# Patient Record
Sex: Female | Born: 1940 | Race: White | Hispanic: No | Smoking: Never smoker
Health system: Southern US, Community
[De-identification: ages and names within clinical notes are randomized; demographics above are authoritative.]

## PROBLEM LIST (undated history)

## (undated) DIAGNOSIS — I10 Essential (primary) hypertension: Secondary | ICD-10-CM

## (undated) DIAGNOSIS — E079 Disorder of thyroid, unspecified: Secondary | ICD-10-CM

## (undated) DIAGNOSIS — E119 Type 2 diabetes mellitus without complications: Secondary | ICD-10-CM

---

## 2012-11-24 ENCOUNTER — Encounter (INDEPENDENT_AMBULATORY_CARE_PROVIDER_SITE_OTHER): Payer: Medicare Other | Admitting: Ophthalmology

## 2012-11-24 DIAGNOSIS — I10 Essential (primary) hypertension: Secondary | ICD-10-CM

## 2012-11-24 DIAGNOSIS — D3130 Benign neoplasm of unspecified choroid: Secondary | ICD-10-CM

## 2012-11-24 DIAGNOSIS — E11319 Type 2 diabetes mellitus with unspecified diabetic retinopathy without macular edema: Secondary | ICD-10-CM

## 2012-11-24 DIAGNOSIS — H2510 Age-related nuclear cataract, unspecified eye: Secondary | ICD-10-CM

## 2012-11-24 DIAGNOSIS — H35039 Hypertensive retinopathy, unspecified eye: Secondary | ICD-10-CM

## 2012-11-24 DIAGNOSIS — H43819 Vitreous degeneration, unspecified eye: Secondary | ICD-10-CM

## 2012-11-24 DIAGNOSIS — E1165 Type 2 diabetes mellitus with hyperglycemia: Secondary | ICD-10-CM

## 2013-11-28 ENCOUNTER — Ambulatory Visit (INDEPENDENT_AMBULATORY_CARE_PROVIDER_SITE_OTHER): Payer: Medicare Other | Admitting: Ophthalmology

## 2013-11-28 DIAGNOSIS — H43819 Vitreous degeneration, unspecified eye: Secondary | ICD-10-CM

## 2013-11-28 DIAGNOSIS — H2510 Age-related nuclear cataract, unspecified eye: Secondary | ICD-10-CM

## 2013-11-28 DIAGNOSIS — H35039 Hypertensive retinopathy, unspecified eye: Secondary | ICD-10-CM

## 2013-11-28 DIAGNOSIS — H3530 Unspecified macular degeneration: Secondary | ICD-10-CM

## 2013-11-28 DIAGNOSIS — I10 Essential (primary) hypertension: Secondary | ICD-10-CM

## 2013-11-28 DIAGNOSIS — D3130 Benign neoplasm of unspecified choroid: Secondary | ICD-10-CM

## 2014-12-18 ENCOUNTER — Ambulatory Visit (INDEPENDENT_AMBULATORY_CARE_PROVIDER_SITE_OTHER): Payer: Medicare Other | Admitting: Ophthalmology

## 2014-12-18 DIAGNOSIS — H43813 Vitreous degeneration, bilateral: Secondary | ICD-10-CM

## 2014-12-18 DIAGNOSIS — D3131 Benign neoplasm of right choroid: Secondary | ICD-10-CM

## 2014-12-18 DIAGNOSIS — H35033 Hypertensive retinopathy, bilateral: Secondary | ICD-10-CM

## 2014-12-18 DIAGNOSIS — I10 Essential (primary) hypertension: Secondary | ICD-10-CM

## 2014-12-18 DIAGNOSIS — H2513 Age-related nuclear cataract, bilateral: Secondary | ICD-10-CM

## 2014-12-18 DIAGNOSIS — H3531 Nonexudative age-related macular degeneration: Secondary | ICD-10-CM

## 2015-12-20 ENCOUNTER — Ambulatory Visit (INDEPENDENT_AMBULATORY_CARE_PROVIDER_SITE_OTHER): Payer: Medicare Other | Admitting: Ophthalmology

## 2015-12-20 DIAGNOSIS — H2513 Age-related nuclear cataract, bilateral: Secondary | ICD-10-CM

## 2015-12-20 DIAGNOSIS — H353111 Nonexudative age-related macular degeneration, right eye, early dry stage: Secondary | ICD-10-CM | POA: Diagnosis not present

## 2015-12-20 DIAGNOSIS — H43813 Vitreous degeneration, bilateral: Secondary | ICD-10-CM | POA: Diagnosis not present

## 2015-12-20 DIAGNOSIS — I10 Essential (primary) hypertension: Secondary | ICD-10-CM

## 2015-12-20 DIAGNOSIS — H35033 Hypertensive retinopathy, bilateral: Secondary | ICD-10-CM | POA: Diagnosis not present

## 2015-12-20 DIAGNOSIS — D3131 Benign neoplasm of right choroid: Secondary | ICD-10-CM

## 2016-08-06 ENCOUNTER — Other Ambulatory Visit (HOSPITAL_COMMUNITY): Payer: Self-pay | Admitting: *Deleted

## 2016-08-06 DIAGNOSIS — Z1231 Encounter for screening mammogram for malignant neoplasm of breast: Secondary | ICD-10-CM

## 2016-09-03 ENCOUNTER — Ambulatory Visit (HOSPITAL_COMMUNITY)
Admission: RE | Admit: 2016-09-03 | Discharge: 2016-09-03 | Disposition: A | Discharge status: Home / Self Care | Payer: Medicare Other | Source: Ambulatory Visit | Attending: *Deleted | Admitting: *Deleted

## 2016-09-03 DIAGNOSIS — Z1231 Encounter for screening mammogram for malignant neoplasm of breast: Secondary | ICD-10-CM | POA: Diagnosis not present

## 2017-01-07 ENCOUNTER — Ambulatory Visit (INDEPENDENT_AMBULATORY_CARE_PROVIDER_SITE_OTHER): Payer: Medicare Other | Admitting: Ophthalmology

## 2017-01-07 DIAGNOSIS — H43813 Vitreous degeneration, bilateral: Secondary | ICD-10-CM

## 2017-01-07 DIAGNOSIS — I10 Essential (primary) hypertension: Secondary | ICD-10-CM | POA: Diagnosis not present

## 2017-01-07 DIAGNOSIS — H353111 Nonexudative age-related macular degeneration, right eye, early dry stage: Secondary | ICD-10-CM | POA: Diagnosis not present

## 2017-01-07 DIAGNOSIS — H2513 Age-related nuclear cataract, bilateral: Secondary | ICD-10-CM

## 2017-01-07 DIAGNOSIS — H35033 Hypertensive retinopathy, bilateral: Secondary | ICD-10-CM

## 2017-01-07 DIAGNOSIS — D3131 Benign neoplasm of right choroid: Secondary | ICD-10-CM | POA: Diagnosis not present

## 2017-08-05 ENCOUNTER — Other Ambulatory Visit (HOSPITAL_COMMUNITY): Payer: Self-pay | Admitting: *Deleted

## 2017-08-05 DIAGNOSIS — Z1231 Encounter for screening mammogram for malignant neoplasm of breast: Secondary | ICD-10-CM

## 2017-09-07 ENCOUNTER — Encounter (HOSPITAL_COMMUNITY): Payer: Self-pay | Admitting: Radiology

## 2017-09-07 ENCOUNTER — Ambulatory Visit (HOSPITAL_COMMUNITY)
Admission: RE | Admit: 2017-09-07 | Discharge: 2017-09-07 | Disposition: A | Discharge status: Home / Self Care | Payer: Medicare Other | Source: Ambulatory Visit | Attending: *Deleted | Admitting: *Deleted

## 2017-09-07 DIAGNOSIS — Z1231 Encounter for screening mammogram for malignant neoplasm of breast: Secondary | ICD-10-CM | POA: Diagnosis present

## 2018-01-13 ENCOUNTER — Ambulatory Visit (INDEPENDENT_AMBULATORY_CARE_PROVIDER_SITE_OTHER): Payer: Medicare Other | Admitting: Ophthalmology

## 2018-01-21 ENCOUNTER — Encounter (INDEPENDENT_AMBULATORY_CARE_PROVIDER_SITE_OTHER): Payer: Medicare Other | Admitting: Ophthalmology

## 2018-01-21 DIAGNOSIS — H43813 Vitreous degeneration, bilateral: Secondary | ICD-10-CM | POA: Diagnosis not present

## 2018-01-21 DIAGNOSIS — H35033 Hypertensive retinopathy, bilateral: Secondary | ICD-10-CM

## 2018-01-21 DIAGNOSIS — D3131 Benign neoplasm of right choroid: Secondary | ICD-10-CM | POA: Diagnosis not present

## 2018-01-21 DIAGNOSIS — H353111 Nonexudative age-related macular degeneration, right eye, early dry stage: Secondary | ICD-10-CM

## 2018-01-21 DIAGNOSIS — H2513 Age-related nuclear cataract, bilateral: Secondary | ICD-10-CM

## 2018-01-21 DIAGNOSIS — I10 Essential (primary) hypertension: Secondary | ICD-10-CM

## 2018-08-23 ENCOUNTER — Other Ambulatory Visit (HOSPITAL_COMMUNITY): Payer: Self-pay | Admitting: *Deleted

## 2018-08-23 DIAGNOSIS — Z1231 Encounter for screening mammogram for malignant neoplasm of breast: Secondary | ICD-10-CM

## 2018-09-13 ENCOUNTER — Ambulatory Visit (HOSPITAL_COMMUNITY)
Admission: RE | Admit: 2018-09-13 | Discharge: 2018-09-13 | Disposition: A | Discharge status: Home / Self Care | Payer: Medicare Other | Source: Ambulatory Visit | Attending: *Deleted | Admitting: *Deleted

## 2018-09-13 DIAGNOSIS — Z1231 Encounter for screening mammogram for malignant neoplasm of breast: Secondary | ICD-10-CM

## 2019-01-24 ENCOUNTER — Encounter (INDEPENDENT_AMBULATORY_CARE_PROVIDER_SITE_OTHER): Payer: Medicare Other | Admitting: Ophthalmology

## 2019-01-24 DIAGNOSIS — I10 Essential (primary) hypertension: Secondary | ICD-10-CM | POA: Diagnosis not present

## 2019-01-24 DIAGNOSIS — D3131 Benign neoplasm of right choroid: Secondary | ICD-10-CM

## 2019-01-24 DIAGNOSIS — H35033 Hypertensive retinopathy, bilateral: Secondary | ICD-10-CM

## 2019-01-24 DIAGNOSIS — H353111 Nonexudative age-related macular degeneration, right eye, early dry stage: Secondary | ICD-10-CM

## 2019-01-24 DIAGNOSIS — H43813 Vitreous degeneration, bilateral: Secondary | ICD-10-CM

## 2019-08-24 ENCOUNTER — Other Ambulatory Visit (HOSPITAL_COMMUNITY): Payer: Self-pay | Admitting: Family

## 2019-08-24 DIAGNOSIS — Z1231 Encounter for screening mammogram for malignant neoplasm of breast: Secondary | ICD-10-CM

## 2019-09-16 ENCOUNTER — Ambulatory Visit (HOSPITAL_COMMUNITY)
Admission: RE | Admit: 2019-09-16 | Discharge: 2019-09-16 | Disposition: A | Discharge status: Home / Self Care | Payer: Medicare Other | Source: Ambulatory Visit | Attending: Family | Admitting: Family

## 2019-09-16 ENCOUNTER — Other Ambulatory Visit: Payer: Self-pay

## 2019-09-16 DIAGNOSIS — Z1231 Encounter for screening mammogram for malignant neoplasm of breast: Secondary | ICD-10-CM | POA: Diagnosis not present

## 2019-09-19 ENCOUNTER — Other Ambulatory Visit (HOSPITAL_COMMUNITY): Payer: Self-pay | Admitting: Family

## 2019-09-19 DIAGNOSIS — R928 Other abnormal and inconclusive findings on diagnostic imaging of breast: Secondary | ICD-10-CM

## 2019-10-04 ENCOUNTER — Ambulatory Visit (HOSPITAL_COMMUNITY)
Admission: RE | Admit: 2019-10-04 | Discharge: 2019-10-04 | Disposition: A | Discharge status: Home / Self Care | Payer: Medicare Other | Source: Ambulatory Visit | Attending: Family | Admitting: Family

## 2019-10-04 ENCOUNTER — Other Ambulatory Visit: Payer: Self-pay

## 2019-10-04 ENCOUNTER — Ambulatory Visit (HOSPITAL_COMMUNITY): Admission: RE | Admit: 2019-10-04 | Payer: Medicare Other | Source: Ambulatory Visit

## 2019-10-04 DIAGNOSIS — R928 Other abnormal and inconclusive findings on diagnostic imaging of breast: Secondary | ICD-10-CM | POA: Insufficient documentation

## 2019-10-04 DIAGNOSIS — Z20822 Contact with and (suspected) exposure to covid-19: Secondary | ICD-10-CM

## 2019-10-07 LAB — NOVEL CORONAVIRUS, NAA: SARS-CoV-2, NAA: NOT DETECTED

## 2020-01-25 ENCOUNTER — Other Ambulatory Visit: Payer: Self-pay

## 2020-01-25 ENCOUNTER — Encounter (INDEPENDENT_AMBULATORY_CARE_PROVIDER_SITE_OTHER): Payer: Medicare Other | Admitting: Ophthalmology

## 2020-01-25 DIAGNOSIS — H35033 Hypertensive retinopathy, bilateral: Secondary | ICD-10-CM

## 2020-01-25 DIAGNOSIS — D3131 Benign neoplasm of right choroid: Secondary | ICD-10-CM

## 2020-01-25 DIAGNOSIS — H43813 Vitreous degeneration, bilateral: Secondary | ICD-10-CM

## 2020-01-25 DIAGNOSIS — I10 Essential (primary) hypertension: Secondary | ICD-10-CM

## 2020-01-25 DIAGNOSIS — H353131 Nonexudative age-related macular degeneration, bilateral, early dry stage: Secondary | ICD-10-CM | POA: Diagnosis not present

## 2020-06-20 ENCOUNTER — Other Ambulatory Visit (HOSPITAL_COMMUNITY): Payer: Self-pay | Admitting: Family

## 2020-06-20 DIAGNOSIS — Z1231 Encounter for screening mammogram for malignant neoplasm of breast: Secondary | ICD-10-CM

## 2020-10-08 ENCOUNTER — Other Ambulatory Visit: Payer: Self-pay

## 2020-10-08 ENCOUNTER — Ambulatory Visit (HOSPITAL_COMMUNITY)
Admission: RE | Admit: 2020-10-08 | Discharge: 2020-10-08 | Disposition: A | Discharge status: Home / Self Care | Payer: Medicare Other | Source: Ambulatory Visit | Attending: Family | Admitting: Family

## 2020-10-08 DIAGNOSIS — Z1231 Encounter for screening mammogram for malignant neoplasm of breast: Secondary | ICD-10-CM | POA: Insufficient documentation

## 2021-01-24 ENCOUNTER — Encounter (INDEPENDENT_AMBULATORY_CARE_PROVIDER_SITE_OTHER): Payer: Medicare Other | Admitting: Ophthalmology

## 2021-02-07 ENCOUNTER — Encounter (INDEPENDENT_AMBULATORY_CARE_PROVIDER_SITE_OTHER): Payer: Medicare Other | Admitting: Ophthalmology

## 2021-02-11 ENCOUNTER — Other Ambulatory Visit: Payer: Self-pay

## 2021-02-11 ENCOUNTER — Encounter (INDEPENDENT_AMBULATORY_CARE_PROVIDER_SITE_OTHER): Payer: Medicare Other | Admitting: Ophthalmology

## 2021-02-11 DIAGNOSIS — H353131 Nonexudative age-related macular degeneration, bilateral, early dry stage: Secondary | ICD-10-CM

## 2021-02-11 DIAGNOSIS — D3131 Benign neoplasm of right choroid: Secondary | ICD-10-CM

## 2021-02-11 DIAGNOSIS — H35033 Hypertensive retinopathy, bilateral: Secondary | ICD-10-CM | POA: Diagnosis not present

## 2021-02-11 DIAGNOSIS — I10 Essential (primary) hypertension: Secondary | ICD-10-CM

## 2021-02-11 DIAGNOSIS — H43813 Vitreous degeneration, bilateral: Secondary | ICD-10-CM

## 2021-09-03 ENCOUNTER — Other Ambulatory Visit (HOSPITAL_COMMUNITY): Payer: Self-pay | Admitting: *Deleted

## 2021-09-03 ENCOUNTER — Other Ambulatory Visit (HOSPITAL_COMMUNITY): Payer: Self-pay | Admitting: Family

## 2021-09-03 DIAGNOSIS — Z1231 Encounter for screening mammogram for malignant neoplasm of breast: Secondary | ICD-10-CM

## 2021-10-11 ENCOUNTER — Ambulatory Visit (HOSPITAL_COMMUNITY): Payer: Medicare Other

## 2021-10-14 ENCOUNTER — Ambulatory Visit (HOSPITAL_COMMUNITY): Payer: Medicare Other

## 2021-10-18 ENCOUNTER — Other Ambulatory Visit: Payer: Self-pay

## 2021-10-18 ENCOUNTER — Ambulatory Visit (HOSPITAL_COMMUNITY)
Admission: RE | Admit: 2021-10-18 | Discharge: 2021-10-18 | Disposition: A | Discharge status: Home / Self Care | Payer: Medicare Other | Source: Ambulatory Visit | Attending: Family | Admitting: Family

## 2021-10-18 DIAGNOSIS — Z1231 Encounter for screening mammogram for malignant neoplasm of breast: Secondary | ICD-10-CM | POA: Diagnosis not present

## 2022-02-17 ENCOUNTER — Encounter (INDEPENDENT_AMBULATORY_CARE_PROVIDER_SITE_OTHER): Payer: Medicare Other | Admitting: Ophthalmology

## 2022-02-28 ENCOUNTER — Encounter (INDEPENDENT_AMBULATORY_CARE_PROVIDER_SITE_OTHER): Payer: Medicare Other | Admitting: Ophthalmology

## 2022-02-28 ENCOUNTER — Other Ambulatory Visit: Payer: Self-pay

## 2022-02-28 DIAGNOSIS — H353112 Nonexudative age-related macular degeneration, right eye, intermediate dry stage: Secondary | ICD-10-CM

## 2022-02-28 DIAGNOSIS — H35033 Hypertensive retinopathy, bilateral: Secondary | ICD-10-CM

## 2022-02-28 DIAGNOSIS — I10 Essential (primary) hypertension: Secondary | ICD-10-CM

## 2022-02-28 DIAGNOSIS — H353121 Nonexudative age-related macular degeneration, left eye, early dry stage: Secondary | ICD-10-CM | POA: Diagnosis not present

## 2022-02-28 DIAGNOSIS — H43813 Vitreous degeneration, bilateral: Secondary | ICD-10-CM

## 2022-02-28 DIAGNOSIS — D3131 Benign neoplasm of right choroid: Secondary | ICD-10-CM

## 2022-09-22 ENCOUNTER — Other Ambulatory Visit (HOSPITAL_COMMUNITY): Payer: Self-pay | Admitting: Internal Medicine

## 2022-09-22 DIAGNOSIS — Z1231 Encounter for screening mammogram for malignant neoplasm of breast: Secondary | ICD-10-CM

## 2022-10-22 ENCOUNTER — Ambulatory Visit (HOSPITAL_COMMUNITY)
Admission: RE | Admit: 2022-10-22 | Discharge: 2022-10-22 | Disposition: A | Discharge status: Home / Self Care | Payer: Medicare Other | Source: Ambulatory Visit | Attending: Internal Medicine | Admitting: Internal Medicine

## 2022-10-22 DIAGNOSIS — Z1231 Encounter for screening mammogram for malignant neoplasm of breast: Secondary | ICD-10-CM | POA: Insufficient documentation

## 2023-02-27 ENCOUNTER — Encounter (INDEPENDENT_AMBULATORY_CARE_PROVIDER_SITE_OTHER): Payer: Medicare Other | Admitting: Ophthalmology

## 2023-04-11 IMAGING — MG MM DIGITAL SCREENING BILAT W/ TOMO AND CAD
8 series · 8 of 24 positions shown · non-contrast
Comparison: Previous exam(s).

CLINICAL DATA: Screening.

EXAM:
DIGITAL SCREENING BILATERAL MAMMOGRAM WITH TOMOSYNTHESIS AND CAD
TECHNIQUE: Bilateral screening digital craniocaudal and mediolateral oblique
mammograms were obtained. Bilateral screening digital breast
tomosynthesis was performed. The images were evaluated with
computer-aided detection.

[L CC synth-2D]
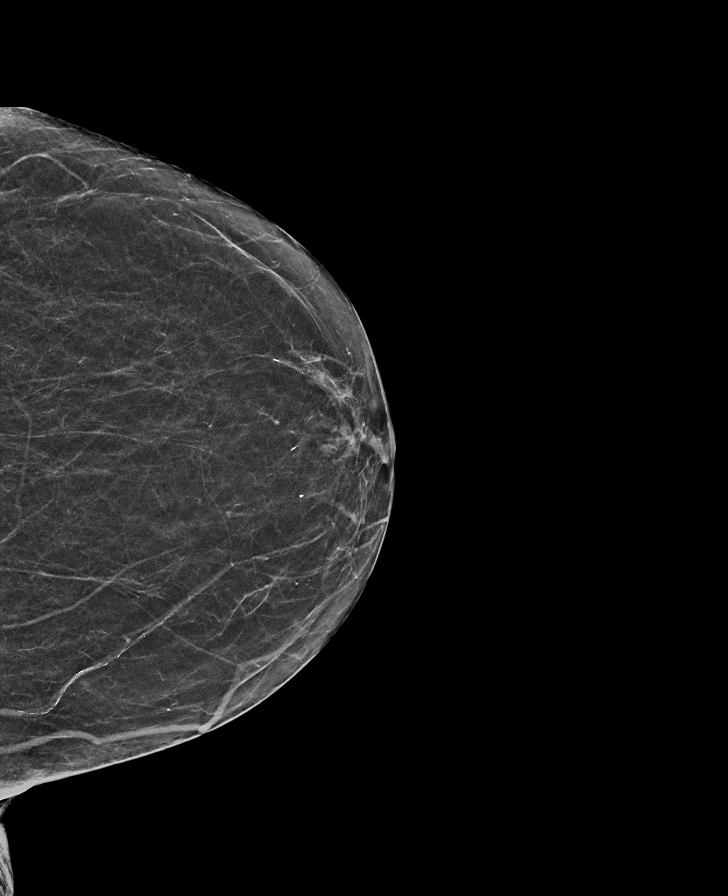

[R MLO synth-2D]
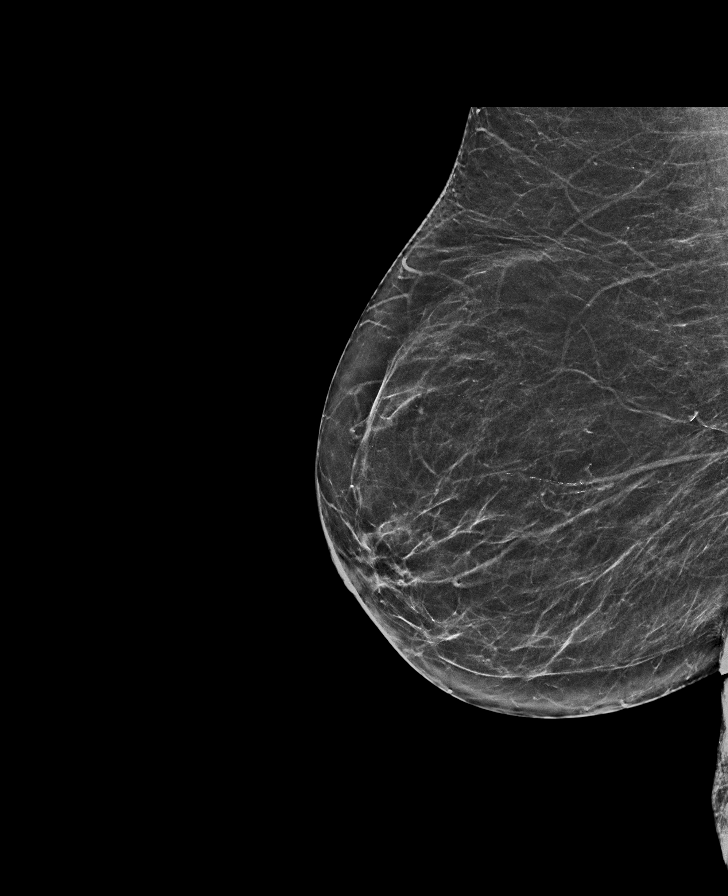

[L MLO synth-2D]
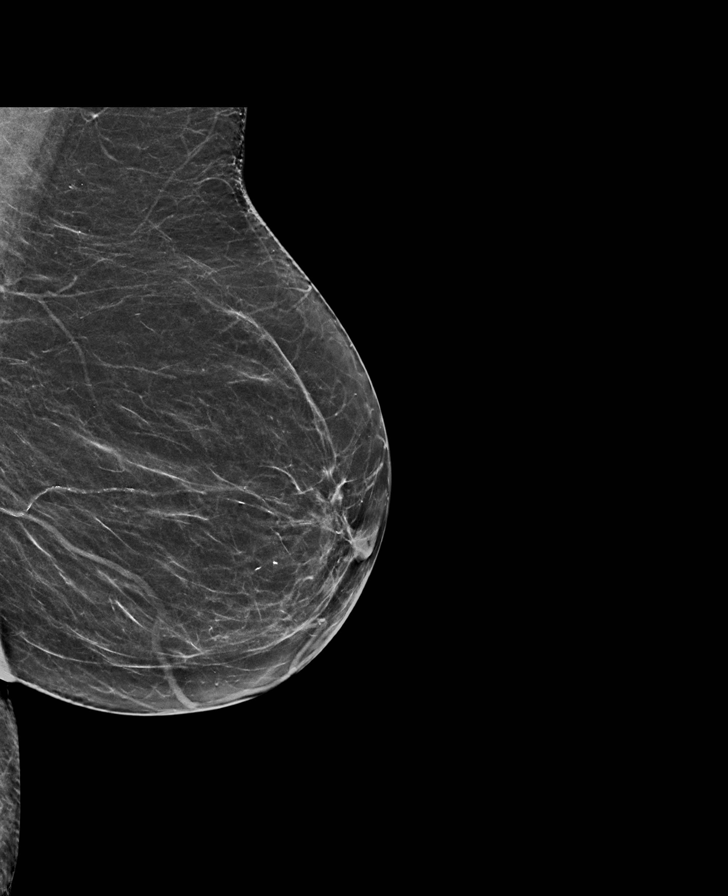

[R CC synth-2D]
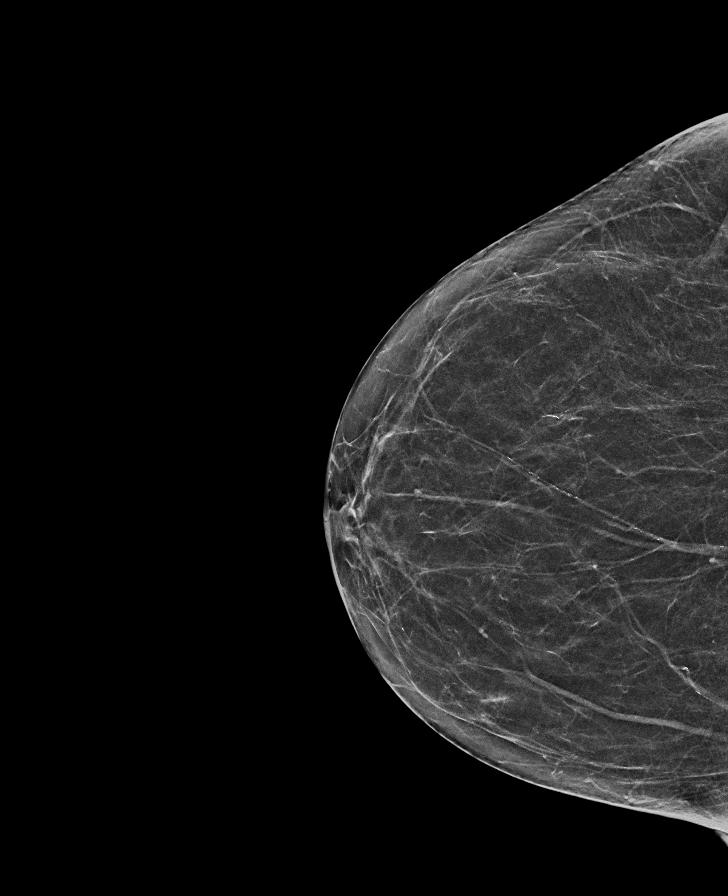

[L MLO tomo · tomo slice 30/59.0]
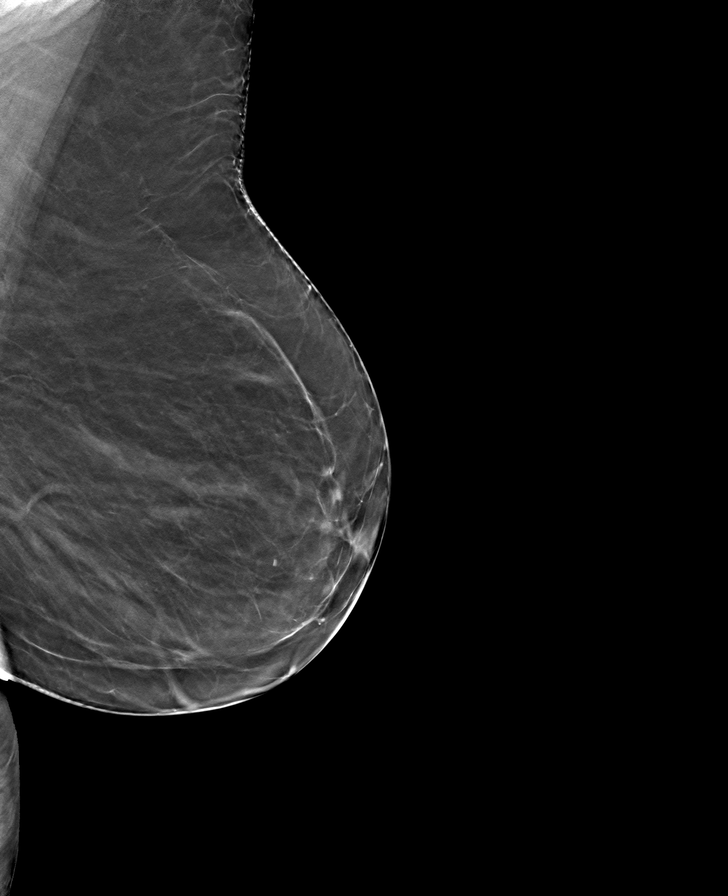

[R CC tomo · tomo slice 27/54.0]
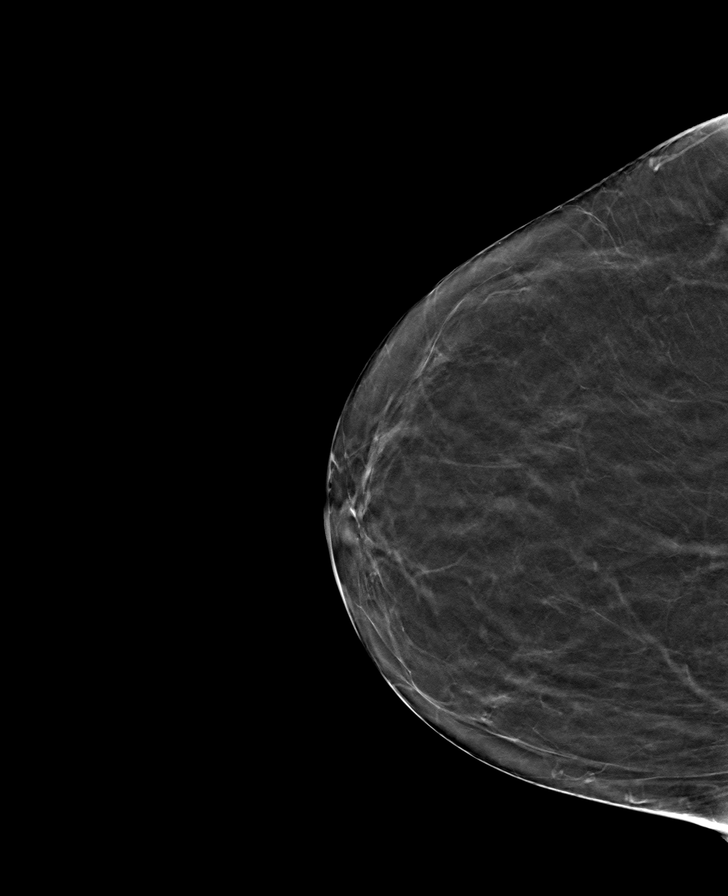

[L CC tomo · tomo slice 25/50.0]
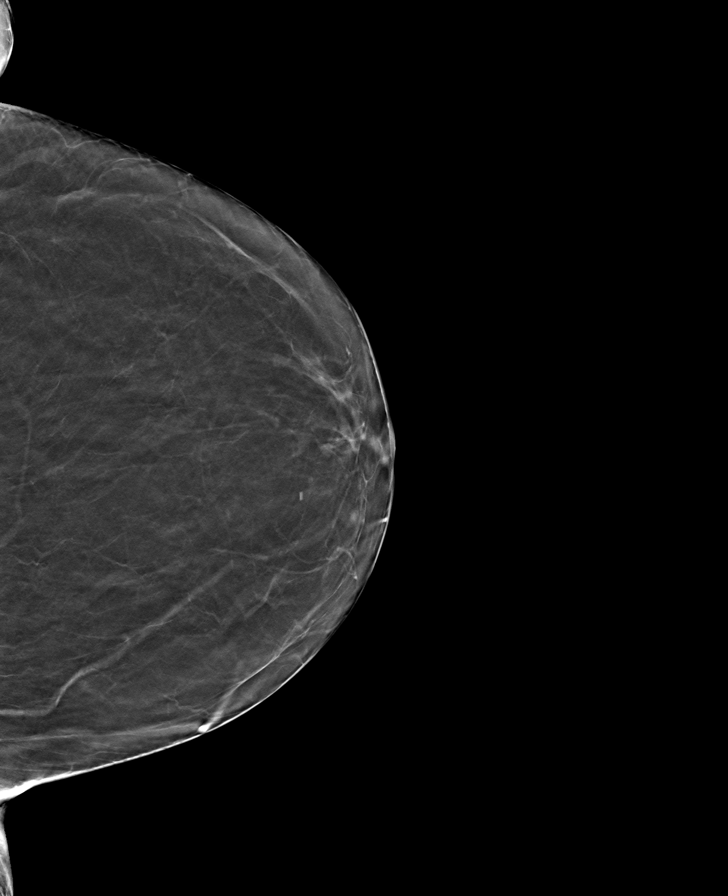

[R MLO tomo · tomo slice 31/60.0]
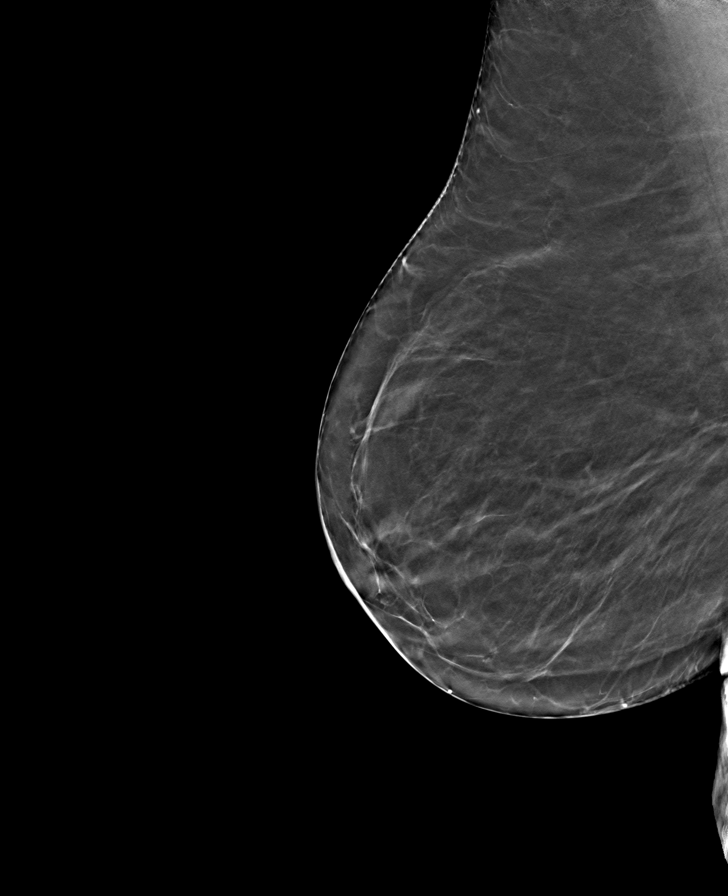

[8 of 24 positions shown; findings below may reference images not displayed]

ACR Breast Density Category b: There are scattered areas of
fibroglandular density.
FINDINGS: There are no findings suspicious for malignancy.
IMPRESSION: No mammographic evidence of malignancy. A result letter of this
screening mammogram will be mailed directly to the patient.

RECOMMENDATION:
Screening mammogram in one year. (Code:51-O-LD2)

BI-RADS CATEGORY  1: Negative.

## 2023-05-03 ENCOUNTER — Encounter (HOSPITAL_COMMUNITY): Payer: Self-pay

## 2023-05-03 ENCOUNTER — Other Ambulatory Visit: Payer: Self-pay

## 2023-05-03 ENCOUNTER — Inpatient Hospital Stay (HOSPITAL_COMMUNITY)
Admission: EM | Admit: 2023-05-03 | Discharge: 2023-05-08 | DRG: 470 | Disposition: A | Discharge status: Skilled Nursing Facility | Payer: Medicare Other | Source: Ambulatory Visit | Attending: Family Medicine | Admitting: Family Medicine

## 2023-05-03 ENCOUNTER — Emergency Department (HOSPITAL_COMMUNITY): Payer: Medicare Other

## 2023-05-03 DIAGNOSIS — Y838 Other surgical procedures as the cause of abnormal reaction of the patient, or of later complication, without mention of misadventure at the time of the procedure: Secondary | ICD-10-CM | POA: Diagnosis not present

## 2023-05-03 DIAGNOSIS — S72009A Fracture of unspecified part of neck of unspecified femur, initial encounter for closed fracture: Secondary | ICD-10-CM | POA: Diagnosis present

## 2023-05-03 DIAGNOSIS — M2110 Varus deformity, not elsewhere classified, unspecified site: Secondary | ICD-10-CM | POA: Diagnosis not present

## 2023-05-03 DIAGNOSIS — S72001S Fracture of unspecified part of neck of right femur, sequela: Secondary | ICD-10-CM | POA: Diagnosis not present

## 2023-05-03 DIAGNOSIS — E119 Type 2 diabetes mellitus without complications: Secondary | ICD-10-CM | POA: Diagnosis present

## 2023-05-03 DIAGNOSIS — S72001A Fracture of unspecified part of neck of right femur, initial encounter for closed fracture: Secondary | ICD-10-CM | POA: Diagnosis not present

## 2023-05-03 DIAGNOSIS — W010XXA Fall on same level from slipping, tripping and stumbling without subsequent striking against object, initial encounter: Secondary | ICD-10-CM | POA: Diagnosis present

## 2023-05-03 DIAGNOSIS — Z885 Allergy status to narcotic agent status: Secondary | ICD-10-CM

## 2023-05-03 DIAGNOSIS — I4891 Unspecified atrial fibrillation: Secondary | ICD-10-CM | POA: Diagnosis not present

## 2023-05-03 DIAGNOSIS — Z79890 Hormone replacement therapy (postmenopausal): Secondary | ICD-10-CM | POA: Diagnosis not present

## 2023-05-03 DIAGNOSIS — Y92010 Kitchen of single-family (private) house as the place of occurrence of the external cause: Secondary | ICD-10-CM

## 2023-05-03 DIAGNOSIS — Z7984 Long term (current) use of oral hypoglycemic drugs: Secondary | ICD-10-CM | POA: Diagnosis not present

## 2023-05-03 DIAGNOSIS — I480 Paroxysmal atrial fibrillation: Secondary | ICD-10-CM | POA: Diagnosis not present

## 2023-05-03 DIAGNOSIS — S72001D Fracture of unspecified part of neck of right femur, subsequent encounter for closed fracture with routine healing: Secondary | ICD-10-CM | POA: Diagnosis not present

## 2023-05-03 DIAGNOSIS — Z887 Allergy status to serum and vaccine status: Secondary | ICD-10-CM

## 2023-05-03 DIAGNOSIS — Z880 Allergy status to penicillin: Secondary | ICD-10-CM | POA: Diagnosis not present

## 2023-05-03 DIAGNOSIS — E0590 Thyrotoxicosis, unspecified without thyrotoxic crisis or storm: Secondary | ICD-10-CM | POA: Diagnosis present

## 2023-05-03 DIAGNOSIS — E039 Hypothyroidism, unspecified: Secondary | ICD-10-CM | POA: Diagnosis not present

## 2023-05-03 DIAGNOSIS — S72141A Displaced intertrochanteric fracture of right femur, initial encounter for closed fracture: Secondary | ICD-10-CM | POA: Diagnosis not present

## 2023-05-03 DIAGNOSIS — I4819 Other persistent atrial fibrillation: Secondary | ICD-10-CM | POA: Diagnosis not present

## 2023-05-03 DIAGNOSIS — S7291XA Unspecified fracture of right femur, initial encounter for closed fracture: Secondary | ICD-10-CM | POA: Diagnosis not present

## 2023-05-03 DIAGNOSIS — I97191 Other postprocedural cardiac functional disturbances following other surgery: Secondary | ICD-10-CM | POA: Diagnosis not present

## 2023-05-03 DIAGNOSIS — Z79899 Other long term (current) drug therapy: Secondary | ICD-10-CM | POA: Diagnosis not present

## 2023-05-03 DIAGNOSIS — M25551 Pain in right hip: Secondary | ICD-10-CM | POA: Diagnosis present

## 2023-05-03 DIAGNOSIS — I9789 Other postprocedural complications and disorders of the circulatory system, not elsewhere classified: Secondary | ICD-10-CM | POA: Diagnosis not present

## 2023-05-03 DIAGNOSIS — I10 Essential (primary) hypertension: Secondary | ICD-10-CM | POA: Diagnosis not present

## 2023-05-03 HISTORY — DX: Essential (primary) hypertension: I10

## 2023-05-03 HISTORY — DX: Type 2 diabetes mellitus without complications: E11.9

## 2023-05-03 HISTORY — DX: Disorder of thyroid, unspecified: E07.9

## 2023-05-03 LAB — CBC WITH DIFFERENTIAL/PLATELET
Abs Immature Granulocytes: 0.05 10*3/uL (ref 0.00–0.07)
Basophils Absolute: 0 10*3/uL (ref 0.0–0.1)
Basophils Relative: 0 %
Eosinophils Absolute: 0 10*3/uL (ref 0.0–0.5)
Eosinophils Relative: 0 %
HCT: 45.1 % (ref 36.0–46.0)
Hemoglobin: 14.3 g/dL (ref 12.0–15.0)
Immature Granulocytes: 1 %
Lymphocytes Relative: 6 %
Lymphs Abs: 0.7 10*3/uL (ref 0.7–4.0)
MCH: 28.4 pg (ref 26.0–34.0)
MCHC: 31.7 g/dL (ref 30.0–36.0)
MCV: 89.7 fL (ref 80.0–100.0)
Monocytes Absolute: 0.5 10*3/uL (ref 0.1–1.0)
Monocytes Relative: 5 %
Neutro Abs: 9.6 10*3/uL — ABNORMAL HIGH (ref 1.7–7.7)
Neutrophils Relative %: 88 %
Platelets: 227 10*3/uL (ref 150–400)
RBC: 5.03 MIL/uL (ref 3.87–5.11)
RDW: 14.7 % (ref 11.5–15.5)
WBC: 10.8 10*3/uL — ABNORMAL HIGH (ref 4.0–10.5)
nRBC: 0 % (ref 0.0–0.2)

## 2023-05-03 LAB — COMPREHENSIVE METABOLIC PANEL
ALT: 18 U/L (ref 0–44)
AST: 22 U/L (ref 15–41)
Albumin: 3.8 g/dL (ref 3.5–5.0)
Alkaline Phosphatase: 71 U/L (ref 38–126)
Anion gap: 11 (ref 5–15)
BUN: 17 mg/dL (ref 8–23)
CO2: 24 mmol/L (ref 22–32)
Calcium: 9 mg/dL (ref 8.9–10.3)
Chloride: 104 mmol/L (ref 98–111)
Creatinine, Ser: 0.82 mg/dL (ref 0.44–1.00)
GFR, Estimated: >60 mL/min (ref >60)
Glucose, Bld: 201 mg/dL — ABNORMAL HIGH (ref 70–99)
Potassium: 3.6 mmol/L (ref 3.5–5.1)
Sodium: 139 mmol/L (ref 135–145)
Total Bilirubin: 0.7 mg/dL (ref 0.3–1.2)
Total Protein: 6.6 g/dL (ref 6.5–8.1)

## 2023-05-03 LAB — MRSA NEXT GEN BY PCR, NASAL: MRSA by PCR Next Gen: NOT DETECTED

## 2023-05-03 LAB — CBG MONITORING, ED: Glucose-Capillary: 155 mg/dL — ABNORMAL HIGH (ref 70–99)

## 2023-05-03 MED ORDER — CLINDAMYCIN PHOSPHATE 900 MG/50ML IV SOLN: 900.0000 mg | Freq: Once | INTRAVENOUS | Status: DC

## 2023-05-03 MED ORDER — ACETAMINOPHEN 650 MG RE SUPP: 650.0000 mg | Freq: Four times a day (QID) | RECTAL | Status: DC | PRN

## 2023-05-03 MED ORDER — CLINDAMYCIN PHOSPHATE 900 MG/50ML IV SOLN
900.0000 mg | INTRAVENOUS | Status: AC
  Administered 2023-05-04: 900 mg via INTRAVENOUS
  Filled 2023-05-03 (×2): qty 50

## 2023-05-03 MED ORDER — TRANEXAMIC ACID-NACL 1000-0.7 MG/100ML-% IV SOLN
1000.0000 mg | INTRAVENOUS | Status: AC
  Administered 2023-05-04: 1000 mg via INTRAVENOUS

## 2023-05-03 MED ORDER — INSULIN ASPART 100 UNIT/ML IJ SOLN
0.0000 [IU] | Freq: Three times a day (TID) | INTRAMUSCULAR | Status: DC
  Administered 2023-05-03 – 2023-05-04 (×2): 2 [IU] via SUBCUTANEOUS
  Administered 2023-05-05: 1 [IU] via SUBCUTANEOUS
  Administered 2023-05-05: 2 [IU] via SUBCUTANEOUS
  Administered 2023-05-06: 3 [IU] via SUBCUTANEOUS
  Administered 2023-05-06: 2 [IU] via SUBCUTANEOUS
  Administered 2023-05-06: 3 [IU] via SUBCUTANEOUS
  Administered 2023-05-07 (×3): 2 [IU] via SUBCUTANEOUS
  Administered 2023-05-08: 1 [IU] via SUBCUTANEOUS
  Filled 2023-05-03: qty 1

## 2023-05-03 MED ORDER — METHOCARBAMOL 1000 MG/10ML IJ SOLN
500.0000 mg | Freq: Four times a day (QID) | INTRAVENOUS | Status: DC | PRN
  Administered 2023-05-03: 500 mg via INTRAVENOUS
  Filled 2023-05-03: qty 5

## 2023-05-03 MED ORDER — FENTANYL CITRATE PF 50 MCG/ML IJ SOSY
25.0000 ug | PREFILLED_SYRINGE | INTRAMUSCULAR | Status: DC | PRN
  Administered 2023-05-03 – 2023-05-04 (×2): 25 ug via INTRAVENOUS
  Filled 2023-05-03 (×3): qty 1

## 2023-05-03 MED ORDER — ONDANSETRON HCL 4 MG PO TABS: 4.0000 mg | ORAL_TABLET | Freq: Four times a day (QID) | ORAL | Status: DC | PRN

## 2023-05-03 MED ORDER — ACETAMINOPHEN 325 MG PO TABS
650.0000 mg | ORAL_TABLET | Freq: Four times a day (QID) | ORAL | Status: DC | PRN
  Administered 2023-05-03 – 2023-05-06 (×2): 650 mg via ORAL
  Filled 2023-05-03 (×2): qty 2

## 2023-05-03 MED ORDER — FENTANYL CITRATE PF 50 MCG/ML IJ SOSY
25.0000 ug | PREFILLED_SYRINGE | Freq: Once | INTRAMUSCULAR | Status: AC
  Administered 2023-05-03: 25 ug via INTRAVENOUS
  Filled 2023-05-03: qty 1

## 2023-05-03 MED ORDER — HYDRALAZINE HCL 20 MG/ML IJ SOLN
10.0000 mg | Freq: Four times a day (QID) | INTRAMUSCULAR | Status: DC | PRN
  Administered 2023-05-04: 10 mg via INTRAVENOUS
  Filled 2023-05-03: qty 1

## 2023-05-03 MED ORDER — LEVOTHYROXINE SODIUM 50 MCG PO TABS: 25.0000 ug | ORAL_TABLET | Freq: Every morning | ORAL | Status: DC

## 2023-05-03 MED ORDER — ONDANSETRON HCL 4 MG/2ML IJ SOLN: 4.0000 mg | Freq: Four times a day (QID) | INTRAMUSCULAR | Status: DC | PRN

## 2023-05-03 MED ORDER — OXYCODONE HCL 5 MG PO TABS
5.0000 mg | ORAL_TABLET | Freq: Four times a day (QID) | ORAL | Status: DC | PRN
  Administered 2023-05-03 – 2023-05-08 (×9): 5 mg via ORAL
  Filled 2023-05-03 (×9): qty 1

## 2023-05-03 MED ORDER — CHLORHEXIDINE GLUCONATE CLOTH 2 % EX PADS
6.0000 | MEDICATED_PAD | Freq: Every day | CUTANEOUS | Status: DC
  Administered 2023-05-04 – 2023-05-07 (×4): 6 via TOPICAL

## 2023-05-03 MED ORDER — IRBESARTAN 150 MG PO TABS
300.0000 mg | ORAL_TABLET | Freq: Every day | ORAL | Status: DC
  Administered 2023-05-03: 300 mg via ORAL
  Filled 2023-05-03: qty 2

## 2023-05-03 MED ORDER — HEPARIN SODIUM (PORCINE) 5000 UNIT/ML IJ SOLN
5000.0000 [IU] | Freq: Once | INTRAMUSCULAR | Status: AC
  Administered 2023-05-03: 5000 [IU] via SUBCUTANEOUS
  Filled 2023-05-03: qty 1

## 2023-05-03 MED ORDER — METHOCARBAMOL 1000 MG/10ML IJ SOLN
INTRAMUSCULAR | Status: AC
  Filled 2023-05-03: qty 10

## 2023-05-03 MED ORDER — LEVOTHYROXINE SODIUM 25 MCG PO TABS
25.0000 ug | ORAL_TABLET | Freq: Every morning | ORAL | Status: DC
  Administered 2023-05-04 – 2023-05-06 (×3): 25 ug via ORAL
  Filled 2023-05-03 (×3): qty 1

## 2023-05-03 NOTE — H&P (Signed)
TRH H&P    Patient Demographics:    Traci Koch, is a 82 y.o. female  MRN: [id number]  DOB - [birth year]  Admit Date - 05/03/2023  Referring MD/NP/PA: Pauline Aus  Outpatient Primary MD for the patient is Rebekah Chesterfield, NP  Patient coming from: Home  Chief complaint-fall   HPI:    Traci Koch  is a 82 y.o. female, with medical history of diabetes mellitus type 2, hypertension, hypothyroidism who came to hospital after patient had fall at home.  Patient said that she was getting ready to go to church when her pants got caught in the drawer causing her to fall.  She fell on the right side, called her son who brought her to the hospital.  Patient admits to having pain in the right hip area.  X-ray of the urgent care was done and patient was advised to go to the ER for further evaluation. In the ED x-ray of the hip shows right hip fracture.  Orthopedic surgery has been consulted by ED provider.  Plan for ORIF in a.m.  Denies passing out. Denies chest pain or shortness of breath No fever or chills, no abdominal pain Did not hit her head, no headache Denies dysuria Patient usually ambulates without any difficulty. 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    Review of systems:    In addition to the HPI above,    All other systems reviewed and are negative.    Past History of the following :    Past Medical History:  Diagnosis Date   Diabetes mellitus without complication (HCC)    Hypertension    Thyroid disease       History reviewed. No pertinent surgical history.    Social History:      Social History   Tobacco Use   Smoking status: Never   Smokeless tobacco: Never  Substance Use Topics   Alcohol use: Never       Family History :      Home Medications:   Prior to Admission medications   Medication Sig Start Date End Date Taking? Authorizing Provider   cholecalciferol (VITAMIN D3) 25 MCG (1000 UNIT) tablet Take 1,000 Units by mouth daily.   Yes [provider]  Cinnamon 500 MG capsule Take by mouth. 02/09/18  Yes [provider]  empagliflozin (JARDIANCE) 25 MG TABS tablet Take 25 mg by mouth daily.   Yes [provider]  ibuprofen (ADVIL) 200 MG tablet Take 800 mg by mouth every 8 (eight) hours as needed for moderate pain.   Yes [provider]  levothyroxine (SYNTHROID) 25 MCG tablet Take 1 tablet by mouth every morning. 03/25/13  Yes [provider]  metFORMIN (GLUCOPHAGE) 500 MG tablet Take 500 mg by mouth at bedtime as needed (low blood sugar). 12/31/04  Yes [provider]  Multiple Vitamins-Minerals (PRESERVISION AREDS 2) CAPS Take 1 tablet by mouth 2 (two) times daily.   Yes [provider]  olmesartan (BENICAR) 40 MG tablet Take 1 tablet by mouth daily. 02/09/18  Yes [provider]  Specialty Vitamins Products (BIOTIN PLUS KERATIN) 10000-100 MCG-MG TABS Take 1 tablet by mouth daily.   Yes [provider]     Allergies:     Allergies  Allergen Reactions   Codeine Other (See Comments)    Hallucinations    Penicillins     Swelling    Tetanus-Diphth-Acell Pertussis Swelling     Physical Exam:   Vitals  Blood pressure (!) 189/60, pulse 69, temperature 98.1 F (36.7 C), temperature source Oral, resp. rate 19, height 5\' 3"  (1.6 m), weight 79.4 kg, SpO2 96 %.  1.  General: Appears in no acute distress  2. Psychiatric: Alert, oriented x 3, intact insight and judgment  3. Neurologic: Cranial nerves II through XII grossly intact, no focal deficit noted  4. HEENMT:  Atraumatic normocephalic, extraocular muscles are intact  5. Respiratory : Lungs are clear to auscultation bilaterally  6. Cardiovascular : S1-S2, regular, no murmur auscultated  7. Gastrointestinal:  Abdomen soft, nontender, no organomegaly  8. Skin:  No rashes  noted  9.Musculoskeletal:  Right lower extremity is externally rotated, right leg is shortened and tenderness to palpation at right hip joint    Data Review:    CBC Recent Labs  Lab 05/03/23 1406  WBC 10.8*  HGB 14.3  HCT 45.1  PLT 227  MCV 89.7  MCH 28.4  MCHC 31.7  RDW 14.7  LYMPHSABS 0.7  MONOABS 0.5  EOSABS 0.0  BASOSABS 0.0   ------------------------------------------------------------------------------------------------------------------  Results for orders placed or performed during the hospital encounter of 05/03/23 (from the past 48 hour(s))  CBC with Differential     Status: Abnormal   Collection Time: 05/03/23  2:06 PM  Result Value Ref Range   WBC 10.8 (H) 4.0 - 10.5 K/uL   RBC 5.03 3.87 - 5.11 MIL/uL   Hemoglobin 14.3 12.0 - 15.0 g/dL   HCT 78.2 95.6 - 21.3 %   MCV 89.7 80.0 - 100.0 fL   MCH 28.4 26.0 - 34.0 pg   MCHC 31.7 30.0 - 36.0 g/dL   RDW 08.6 57.8 - 46.9 %   Platelets 227 150 - 400 K/uL   nRBC 0.0 0.0 - 0.2 %   Neutrophils Relative % 88 %   Neutro Abs 9.6 (H) 1.7 - 7.7 K/uL   Lymphocytes Relative 6 %   Lymphs Abs 0.7 0.7 - 4.0 K/uL   Monocytes Relative 5 %   Monocytes Absolute 0.5 0.1 - 1.0 K/uL   Eosinophils Relative 0 %   Eosinophils Absolute 0.0 0.0 - 0.5 K/uL   Basophils Relative 0 %   Basophils Absolute 0.0 0.0 - 0.1 K/uL   Immature Granulocytes 1 %   Abs Immature Granulocytes 0.05 0.00 - 0.07 K/uL    Comment: Performed at [facility], [address], [place], [place] [address]  Comprehensive metabolic panel     Status: Abnormal   Collection Time: 05/03/23  2:06 PM  Result Value Ref Range   Sodium 139 135 - 145 mmol/L   Potassium 3.6 3.5 - 5.1 mmol/L   Chloride 104 98 - 111 mmol/L   CO2 24 22 - 32 mmol/L   Glucose, Bld 201 (H) 70 - 99 mg/dL    Comment: Glucose reference range applies only to samples taken after fasting for at least 8 hours.   BUN 17 8 - 23 mg/dL   Creatinine, Ser 8.41 0.44 - 1.00 mg/dL   Calcium 9.0 8.9 - 32.4  mg/dL   Total Protein 6.6 6.5 - 8.1  g/dL   Albumin 3.8 3.5 - 5.0 g/dL   AST 22 15 - 41 U/L   ALT 18 0 - 44 U/L   Alkaline Phosphatase 71 38 - 126 U/L   Total Bilirubin 0.7 0.3 - 1.2 mg/dL   GFR, Estimated >16 >10 mL/min    Comment: (NOTE) Calculated using the CKD-EPI Creatinine Equation (2021)    Anion gap 11 5 - 15    Comment: Performed at [facility], [address], [place], [place] [address]    Chemistries  Recent Labs  Lab 05/03/23 1406  NA 139  K 3.6  CL 104  CO2 24  GLUCOSE 201*  BUN 17  CREATININE 0.82  CALCIUM 9.0  AST 22  ALT 18  ALKPHOS 71  BILITOT 0.7   ------------------------------------------------------------------------------------------------------------------  ------------------------------------------------------------------------------------------------------------------ GFR: Estimated Creatinine Clearance: 51.9 mL/min (by C-G formula based on SCr of 0.82 mg/dL). Liver Function Tests: Recent Labs  Lab 05/03/23 1406  AST 22  ALT 18  ALKPHOS 71  BILITOT 0.7  PROT 6.6  ALBUMIN 3.8   No results for input(s): "LIPASE", "AMYLASE" in the last 168 hours. No results for input(s): "AMMONIA" in the last 168 hours. Coagulation Profile: No results for input(s): "INR", "PROTIME" in the last 168 hours. Cardiac Enzymes: No results for input(s): "CKTOTAL", "CKMB", "CKMBINDEX", "TROPONINI" in the last 168 hours. BNP (last 3 results) No results for input(s): "PROBNP" in the last 8760 hours. HbA1C: No results for input(s): "HGBA1C" in the last 72 hours. CBG: No results for input(s): "GLUCAP" in the last 168 hours. Lipid Profile: No results for input(s): "CHOL", "HDL", "LDLCALC", "TRIG", "CHOLHDL", "LDLDIRECT" in the last 72 hours. Thyroid Function Tests: No results for input(s): "TSH", "T4TOTAL", "FREET4", "T3FREE", "THYROIDAB" in the last 72 hours. Anemia Panel: No results for input(s): "VITAMINB12", "FOLATE", "FERRITIN", "TIBC", "IRON", "RETICCTPCT"  in the last 72 hours.  --------------------------------------------------------------------------------------------------------------- Urine analysis: No results found for: "COLORURINE", "APPEARANCEUR", "LABSPEC", "PHURINE", "GLUCOSEU", "HGBUR", "BILIRUBINUR", "KETONESUR", "PROTEINUR", "UROBILINOGEN", "NITRITE", "LEUKOCYTESUR"    Imaging Results:    DG Chest 1 View  Result Date: 05/03/2023 CLINICAL DATA:  Larey Seat, right hip fracture EXAM: CHEST  1 VIEW COMPARISON:  None Available. FINDINGS: Single frontal view of the chest demonstrates mild enlargement of the cardiac silhouette. There is central pulmonary vascular congestion, without airspace disease, effusion, or pneumothorax. No acute bony abnormalities. IMPRESSION: 1. Enlarged cardiac silhouette, with central pulmonary vascular congestion. No overt edema. Electronically Signed   By: Sharlet Salina M.D.   On: 05/03/2023 15:04   DG Hip Unilat W or Wo Pelvis 2-3 Views Right  Result Date: 05/03/2023 CLINICAL DATA:  Larey Seat, fracture EXAM: DG HIP (WITH OR WITHOUT PELVIS) 2-3V RIGHT COMPARISON:  None Available. FINDINGS: Frontal view of the pelvis as well as frontal and cross-table lateral views of the right hip are obtained. There is a comminuted intertrochanteric right hip fracture with marked impaction and varus angulation at the fracture site. No dislocation. Symmetrical bilateral hip osteoarthritis. Remainder of the bony pelvis is unremarkable. IMPRESSION: 1. Acute comminuted intertrochanteric right hip fracture, with impaction and varus angulation at the fracture site. Electronically Signed   By: Sharlet Salina M.D.   On: 05/03/2023 15:03    My personal review of EKG: Rhythm NSR, no significant ST changes   Assessment & Plan:    Principal Problem:   Hip fracture (HCC)   Right hip fracture-orthopedic surgery has been consulted by ED provider.  Plan for ORIF in a.m. will keep n.p.o. after midnight.  Start oxycodone 5  mg p.o. every 6 hours as needed  for pain.  Hypertension -elevated blood pressure-blood pressure reading has been elevated likely from pain.  Start hydralazine 10 mg IV every 4 hours as needed for BP greater than 160/100.  Continue olmesartan.  Hypothyroidism-continue Synthroid  Diabetes mellitus type 2-patient takes metformin and empagliflozin at home.  Will hold both medications.  Start sliding scale insulin with NovoLog.  Check CBG every 6 hours.  Preop risk stratification -patient does not have any significant cardiac history, she has greater than 4 METS.  Due to her advanced age she is moderate risk for surgery.    DVT Prophylaxis-   1 dose of heparin 5000 units subcu x 1 will be given.  Further DVT prophylaxis as per orthopedics after surgery.  AM Labs Ordered, also please review Full Orders  Family Communication: Admission, patients condition and plan of care including tests being ordered have been discussed with the patient  who indicate understanding and agree with the plan and Code Status.  Code Status: Full code  Admission status: Inpatient :The appropriate admission status for this patient is INPATIENT. Inpatient status is judged to be reasonable and necessary in order to provide the required intensity of service to ensure the patient's safety. The patient's presenting symptoms, physical exam findings, and initial radiographic and laboratory data in the context of their chronic comorbidities is felt to place them at high risk for further clinical deterioration. Furthermore, it is not anticipated that the patient will be medically stable for discharge from the hospital within 2 midnights of admission. The following factors support the admission status of inpatient.        * I certify that at the point of admission it is my clinical judgment that the patient will require inpatient hospital care spanning beyond 2 midnights from the point of admission due to high intensity of service, high risk for further  deterioration and high frequency of surveillance required.*  Time spent in minutes : 60 minutes   Ariannie Penaloza S Azizah Lisle M.D

## 2023-05-03 NOTE — ED Provider Notes (Signed)
[place] EMERGENCY DEPARTMENT AT [facility] Provider Note   CSN: [id number] Arrival date & time: 05/03/23  1325     History  Chief Complaint  Patient presents with   Marletta Lor    Traci Koch is a 82 y.o. female.   Fall Pertinent negatives include no chest pain, no abdominal pain, no headaches and no shortness of breath.       Traci Koch is a 82 y.o. female with past medical history of type 2 diabetes, hypertension, and thyroid disease who presents to the Emergency Department from an urgent care for evaluation of hip pain secondary to mechanical fall that occurred earlier this morning.  Patient states that she was getting ready for church and the leg of her pants got caught on a drawer causing her to fall.  Fell on the right side.  Describes pain of the right hip area, unable to stand or bear weight.  Had x-ray at urgent care and advised to come to ER for further evaluation.  Patient denies head injury, LOC, neck pain, back pain and knee pain.  Does not take blood thinners.  Denies any headache dizziness nausea or vomiting. She has been n.p.o. this morning.    Home Medications Prior to Admission medications   Medication Sig Start Date End Date Taking? Authorizing Provider  cholecalciferol (VITAMIN D3) 25 MCG (1000 UNIT) tablet Take 1,000 Units by mouth daily.   Yes [provider]  Cinnamon 500 MG capsule Take by mouth. 02/09/18  Yes [provider]  empagliflozin (JARDIANCE) 25 MG TABS tablet Take 25 mg by mouth daily.   Yes [provider]  ibuprofen (ADVIL) 200 MG tablet Take 800 mg by mouth every 8 (eight) hours as needed for moderate pain.   Yes [provider]  levothyroxine (SYNTHROID) 25 MCG tablet Take 1 tablet by mouth every morning. 03/25/13  Yes [provider]  metFORMIN (GLUCOPHAGE) 500 MG tablet Take 500 mg by mouth at bedtime as needed (low blood sugar). 12/31/04  Yes [provider]  Multiple  Vitamins-Minerals (PRESERVISION AREDS 2) CAPS Take 1 tablet by mouth 2 (two) times daily.   Yes [provider]  olmesartan (BENICAR) 40 MG tablet Take 1 tablet by mouth daily. 02/09/18  Yes [provider]  Specialty Vitamins Products (BIOTIN PLUS KERATIN) 10000-100 MCG-MG TABS Take 1 tablet by mouth daily.   Yes [provider]      Allergies    Codeine, Penicillins, and Tetanus-diphth-acell pertussis    Review of Systems   Review of Systems  Constitutional:  Negative for appetite change, chills and fever.  Eyes:  Negative for visual disturbance.  Respiratory:  Negative for chest tightness and shortness of breath.   Cardiovascular:  Negative for chest pain.  Gastrointestinal:  Negative for abdominal pain, diarrhea, nausea and vomiting.  Genitourinary:  Negative for dysuria and flank pain.  Musculoskeletal:  Positive for arthralgias (Right hip pain). Negative for back pain and neck pain.  Skin:  Negative for color change and wound.  Neurological:  Negative for dizziness, weakness, numbness and headaches.  Psychiatric/Behavioral:  Negative for confusion.     Physical Exam Updated Vital Signs BP (!) 195/66   Pulse 65   Temp 98.1 F (36.7 C) (Oral)   Resp 18   Ht 5\' 3"  (1.6 m)   Wt 79.4 kg   LMP  (LMP Unknown) Comment: menopausal  SpO2 96%   BMI 31.00 kg/m  Physical Exam Vitals and nursing note reviewed.  Constitutional:      General: She is not in acute distress.    Appearance: Normal appearance. She is not ill-appearing or toxic-appearing.  HENT:     Head: Normocephalic and atraumatic.     Comments: No tenderness of the scalp or palpable hematomas. Eyes:     Extraocular Movements: Extraocular movements intact.     Conjunctiva/sclera: Conjunctivae normal.     Pupils: Pupils are equal, round, and reactive to light.  Cardiovascular:     Rate and Rhythm: Normal rate and regular rhythm.     Pulses: Normal pulses.  Pulmonary:     Effort:  Pulmonary effort is normal.  Abdominal:     General: There is no distension.     Palpations: Abdomen is soft.     Tenderness: There is no abdominal tenderness.  Musculoskeletal:        General: Tenderness and signs of injury present.     Cervical back: Normal range of motion. No tenderness.     Right hip: Bony tenderness present. No lacerations or crepitus. Decreased range of motion.     Right lower leg: No edema.     Left lower leg: No edema.     Comments: Tender to palpation of the anterior right hip, right leg is shortened and externally rotated.  Skin:    General: Skin is warm.     Capillary Refill: Capillary refill takes less than 2 seconds.  Neurological:     General: No focal deficit present.     Mental Status: She is alert.     Sensory: No sensory deficit.     Motor: No weakness.     ED Results / Procedures / Treatments   Labs (all labs ordered are listed, but only abnormal results are displayed) Labs Reviewed  CBC WITH DIFFERENTIAL/PLATELET - Abnormal; Notable for the following components:      Result Value   WBC 10.8 (*)    Neutro Abs 9.6 (*)    All other components within normal limits  COMPREHENSIVE METABOLIC PANEL - Abnormal; Notable for the following components:   Glucose, Bld 201 (*)    All other components within normal limits    EKG EKG Interpretation  Date/Time:  Sunday May 03 2023 15:41:05 EDT Ventricular Rate:  78 PR Interval:  230 QRS Duration: 100 QT Interval:  384 QTC Calculation: 438 R Axis:   -64 Text Interpretation: Sinus rhythm Prolonged PR interval Left ventricular hypertrophy No previous tracing Confirmed by Cathren Laine ([id number]) on 05/03/2023 4:02:06 PM  Radiology DG Chest 1 View  Result Date: 05/03/2023 CLINICAL DATA:  Larey Seat, right hip fracture EXAM: CHEST  1 VIEW COMPARISON:  None Available. FINDINGS: Single frontal view of the chest demonstrates mild enlargement of the cardiac silhouette. There is central pulmonary vascular congestion,  without airspace disease, effusion, or pneumothorax. No acute bony abnormalities. IMPRESSION: 1. Enlarged cardiac silhouette, with central pulmonary vascular congestion. No overt edema. Electronically Signed   By: Sharlet Salina M.D.   On: 05/03/2023 15:04   DG Hip Unilat W or Wo Pelvis 2-3 Views Right  Result Date: 05/03/2023 CLINICAL DATA:  Larey Seat, fracture EXAM: DG HIP (WITH OR WITHOUT PELVIS) 2-3V RIGHT COMPARISON:  None Available. FINDINGS: Frontal view of the pelvis as well as frontal and cross-table lateral views of the right hip are obtained. There is a comminuted intertrochanteric right hip fracture with marked impaction and varus angulation at the fracture site. No dislocation. Symmetrical bilateral hip osteoarthritis. Remainder of the bony pelvis is unremarkable.  IMPRESSION: 1. Acute comminuted intertrochanteric right hip fracture, with impaction and varus angulation at the fracture site. Electronically Signed   By: Sharlet Salina M.D.   On: 05/03/2023 15:03    Procedures Procedures    Medications Ordered in ED Medications  fentaNYL (SUBLIMAZE) injection 25 mcg (25 mcg Intravenous Given 05/03/23 1425)    ED Course/ Medical Decision Making/ A&P                             Medical Decision Making Patient here from urgent care for evaluation of possible hip fracture secondary to mechanical fall that occurred this morning.  Patient has pain of her right hip, unable to stand or bear weight to the extremity.  On exam, right leg is shortened and externally rotated.  Neurovascularly intact.  Clinically, high suspicion for fracture of the hip.  Pelvic fracture, dislocation, musculoskeletal injury ligamentous injury also considered  Amount and/or Complexity of Data Reviewed Labs: ordered.    Details: Labs interpreted by me, no evidence of leukocytosis, chemistries show blood sugar of 201 otherwise unremarkable Radiology: ordered.    Details: X-ray of the right hip shows acute comminuted intra  troches fracture of the right hip with impaction and varus angulation at the fracture site  Chest x-ray enlarged cardiac silhouette with central pulmonary vascular congestion no overt edema Discussion of management or test interpretation with external provider(s):   Discussed findings with orthopedics, Dr Dallas Schimke, who requests admission to hospitalist service, patient to be n.p.o. after midnight with plan for surgery tomorrow   Discussed with [facility] hospitalist, Dr. Sharl Ma who agrees to admit  Risk Prescription drug management.           Final Clinical Impression(s) / ED Diagnoses Final diagnoses:  Closed fracture of right hip, initial encounter [facility])    Rx / DC Orders ED Discharge Orders     None         Pauline Aus, PA-C 05/03/23 1615    Cathren Laine, MD 05/05/23 1250

## 2023-05-03 NOTE — ED Triage Notes (Signed)
Patient arrives via POV from [facility] where xrays were taken and the patient has a fracture of unspecified part of neck of right femur from a call at 0830. Pant leg got caught on a drawer and patient tripped falling to the ground

## 2023-05-03 NOTE — ED Notes (Signed)
Attempted to call report x 1  

## 2023-05-04 ENCOUNTER — Other Ambulatory Visit: Payer: Self-pay

## 2023-05-04 ENCOUNTER — Inpatient Hospital Stay (HOSPITAL_COMMUNITY): Payer: Medicare Other

## 2023-05-04 ENCOUNTER — Inpatient Hospital Stay (HOSPITAL_COMMUNITY): Payer: Medicare Other | Admitting: Anesthesiology

## 2023-05-04 ENCOUNTER — Encounter (HOSPITAL_COMMUNITY)
Admission: EM | Disposition: A | Discharge status: Skilled Nursing Facility | Payer: Self-pay | Source: Ambulatory Visit | Attending: Family Medicine

## 2023-05-04 ENCOUNTER — Encounter (HOSPITAL_COMMUNITY): Payer: Self-pay | Admitting: Family Medicine

## 2023-05-04 DIAGNOSIS — E119 Type 2 diabetes mellitus without complications: Secondary | ICD-10-CM

## 2023-05-04 DIAGNOSIS — I10 Essential (primary) hypertension: Secondary | ICD-10-CM

## 2023-05-04 DIAGNOSIS — S72141A Displaced intertrochanteric fracture of right femur, initial encounter for closed fracture: Secondary | ICD-10-CM

## 2023-05-04 DIAGNOSIS — S72001A Fracture of unspecified part of neck of right femur, initial encounter for closed fracture: Secondary | ICD-10-CM

## 2023-05-04 HISTORY — PX: HIP ARTHROPLASTY: SHX981

## 2023-05-04 LAB — CBC
HCT: 40.9 % (ref 36.0–46.0)
Hemoglobin: 13.1 g/dL (ref 12.0–15.0)
MCH: 28.9 pg (ref 26.0–34.0)
MCHC: 32 g/dL (ref 30.0–36.0)
MCV: 90.3 fL (ref 80.0–100.0)
Platelets: 189 10*3/uL (ref 150–400)
RBC: 4.53 MIL/uL (ref 3.87–5.11)
RDW: 14.9 % (ref 11.5–15.5)
WBC: 6.8 10*3/uL (ref 4.0–10.5)
nRBC: 0 % (ref 0.0–0.2)

## 2023-05-04 LAB — COMPREHENSIVE METABOLIC PANEL
ALT: 14 U/L (ref 0–44)
AST: 16 U/L (ref 15–41)
Albumin: 3.2 g/dL — ABNORMAL LOW (ref 3.5–5.0)
Alkaline Phosphatase: 60 U/L (ref 38–126)
Anion gap: 8 (ref 5–15)
BUN: 16 mg/dL (ref 8–23)
CO2: 25 mmol/L (ref 22–32)
Calcium: 8.6 mg/dL — ABNORMAL LOW (ref 8.9–10.3)
Chloride: 105 mmol/L (ref 98–111)
Creatinine, Ser: 0.75 mg/dL (ref 0.44–1.00)
GFR, Estimated: >60 mL/min (ref >60)
Glucose, Bld: 150 mg/dL — ABNORMAL HIGH (ref 70–99)
Potassium: 3.7 mmol/L (ref 3.5–5.1)
Sodium: 138 mmol/L (ref 135–145)
Total Bilirubin: 1.1 mg/dL (ref 0.3–1.2)
Total Protein: 5.9 g/dL — ABNORMAL LOW (ref 6.5–8.1)

## 2023-05-04 LAB — GLUCOSE, CAPILLARY
Glucose-Capillary: 135 mg/dL — ABNORMAL HIGH (ref 70–99)
Glucose-Capillary: 151 mg/dL — ABNORMAL HIGH (ref 70–99)
Glucose-Capillary: 164 mg/dL — ABNORMAL HIGH (ref 70–99)
Glucose-Capillary: 168 mg/dL — ABNORMAL HIGH (ref 70–99)
Glucose-Capillary: 178 mg/dL — ABNORMAL HIGH (ref 70–99)

## 2023-05-04 LAB — TYPE AND SCREEN
ABO/RH(D): AB POS
Antibody Screen: NEGATIVE

## 2023-05-04 SURGERY — HEMIARTHROPLASTY, HIP, DIRECT ANTERIOR APPROACH, FOR FRACTURE
Anesthesia: General | Site: Hip | Laterality: Right

## 2023-05-04 MED ORDER — FENTANYL CITRATE (PF) 100 MCG/2ML IJ SOLN
INTRAMUSCULAR | Status: AC
  Filled 2023-05-04: qty 2

## 2023-05-04 MED ORDER — CLINDAMYCIN PHOSPHATE 900 MG/50ML IV SOLN
INTRAVENOUS | Status: DC | PRN
  Administered 2023-05-04: 900 mg via INTRAVENOUS

## 2023-05-04 MED ORDER — FENTANYL CITRATE PF 50 MCG/ML IJ SOSY
25.0000 ug | PREFILLED_SYRINGE | Freq: Once | INTRAMUSCULAR | Status: AC | PRN
  Administered 2023-05-04: 25 ug via INTRAVENOUS

## 2023-05-04 MED ORDER — FENTANYL CITRATE PF 50 MCG/ML IJ SOSY
25.0000 ug | PREFILLED_SYRINGE | INTRAMUSCULAR | Status: DC | PRN
  Administered 2023-05-04: 50 ug via INTRAVENOUS
  Filled 2023-05-04 (×2): qty 1

## 2023-05-04 MED ORDER — HYDRALAZINE HCL 20 MG/ML IJ SOLN: 10.0000 mg | Freq: Four times a day (QID) | INTRAMUSCULAR | Status: DC | PRN

## 2023-05-04 MED ORDER — PROPOFOL 500 MG/50ML IV EMUL
INTRAVENOUS | Status: AC
  Filled 2023-05-04: qty 50

## 2023-05-04 MED ORDER — VANCOMYCIN HCL 1000 MG IV SOLR
INTRAVENOUS | Status: DC | PRN
  Administered 2023-05-04: 1000 mg via TOPICAL

## 2023-05-04 MED ORDER — OXYCODONE HCL 5 MG/5ML PO SOLN: 5.0000 mg | Freq: Once | ORAL | Status: DC | PRN

## 2023-05-04 MED ORDER — HYDROMORPHONE HCL 1 MG/ML IJ SOLN
0.5000 mg | INTRAMUSCULAR | Status: DC | PRN
  Administered 2023-05-04 – 2023-05-05 (×3): 0.5 mg via INTRAVENOUS
  Filled 2023-05-04 (×3): qty 0.5

## 2023-05-04 MED ORDER — LACTATED RINGERS IV SOLN: INTRAVENOUS | Status: DC | PRN

## 2023-05-04 MED ORDER — SODIUM CHLORIDE 0.9 % IR SOLN
Status: DC | PRN
  Administered 2023-05-04: 1000 mL
  Administered 2023-05-04: 3000 mL

## 2023-05-04 MED ORDER — LIDOCAINE HCL (CARDIAC) PF 50 MG/5ML IV SOSY
PREFILLED_SYRINGE | INTRAVENOUS | Status: DC | PRN
  Administered 2023-05-04: 80 mg via INTRAVENOUS

## 2023-05-04 MED ORDER — BUPIVACAINE HCL (PF) 0.5 % IJ SOLN
INTRAMUSCULAR | Status: DC | PRN
  Administered 2023-05-04: 30 mL

## 2023-05-04 MED ORDER — OXYCODONE HCL 5 MG PO TABS: 5.0000 mg | ORAL_TABLET | Freq: Once | ORAL | Status: DC | PRN

## 2023-05-04 MED ORDER — METHOCARBAMOL 1000 MG/10ML IJ SOLN
INTRAMUSCULAR | Status: AC
  Filled 2023-05-04: qty 10

## 2023-05-04 MED ORDER — FENTANYL CITRATE (PF) 100 MCG/2ML IJ SOLN
INTRAMUSCULAR | Status: DC | PRN
  Administered 2023-05-04: 25 ug via INTRAVENOUS
  Administered 2023-05-04: 50 ug via INTRAVENOUS
  Administered 2023-05-04 (×4): 25 ug via INTRAVENOUS

## 2023-05-04 MED ORDER — DEXAMETHASONE SODIUM PHOSPHATE 10 MG/ML IJ SOLN
INTRAMUSCULAR | Status: AC
  Filled 2023-05-04: qty 1

## 2023-05-04 MED ORDER — CHLORHEXIDINE GLUCONATE 0.12 % MT SOLN
OROMUCOSAL | Status: AC
  Administered 2023-05-04: 15 mL
  Filled 2023-05-04: qty 45

## 2023-05-04 MED ORDER — SODIUM CHLORIDE 0.9 % IV SOLN: INTRAVENOUS | Status: DC

## 2023-05-04 MED ORDER — ONDANSETRON HCL 4 MG/2ML IJ SOLN: 4.0000 mg | Freq: Once | INTRAMUSCULAR | Status: DC | PRN

## 2023-05-04 MED ORDER — ONDANSETRON HCL 4 MG/2ML IJ SOLN
INTRAMUSCULAR | Status: AC
  Filled 2023-05-04: qty 2

## 2023-05-04 MED ORDER — ONDANSETRON HCL 4 MG/2ML IJ SOLN
INTRAMUSCULAR | Status: DC | PRN
  Administered 2023-05-04: 4 mg via INTRAVENOUS

## 2023-05-04 MED ORDER — PROPOFOL 10 MG/ML IV BOLUS
INTRAVENOUS | Status: DC | PRN
  Administered 2023-05-04: 120 mg via INTRAVENOUS
  Administered 2023-05-04: 50 mg via INTRAVENOUS

## 2023-05-04 MED ORDER — BUPIVACAINE HCL (PF) 0.5 % IJ SOLN
INTRAMUSCULAR | Status: AC
  Filled 2023-05-04: qty 30

## 2023-05-04 MED ORDER — PROPOFOL 10 MG/ML IV BOLUS
INTRAVENOUS | Status: AC
  Filled 2023-05-04: qty 20

## 2023-05-04 MED ORDER — VANCOMYCIN HCL 1000 MG IV SOLR
INTRAVENOUS | Status: AC
  Filled 2023-05-04: qty 20

## 2023-05-04 MED ORDER — CLINDAMYCIN PHOSPHATE 900 MG/50ML IV SOLN
900.0000 mg | Freq: Three times a day (TID) | INTRAVENOUS | Status: AC
  Administered 2023-05-04 – 2023-05-05 (×2): 900 mg via INTRAVENOUS
  Filled 2023-05-04 (×2): qty 50

## 2023-05-04 MED ORDER — DEXMEDETOMIDINE HCL IN NACL 80 MCG/20ML IV SOLN
INTRAVENOUS | Status: AC
  Filled 2023-05-04: qty 20

## 2023-05-04 SURGICAL SUPPLY — 78 items
APL PRP STRL LF DISP 70% ISPRP (MISCELLANEOUS) ×2
BIPOLAR PROS AML 44 (Hips) ×2 IMPLANT
BIT DRILL AO GAMMA 4.2X180 (BIT) IMPLANT
BLADE SAGITTAL 25.0X1.27X90 (BLADE) ×1 IMPLANT
BLADE SURG SZ10 CARB STEEL (BLADE) ×3 IMPLANT
BNDG CMPR 5X4 CHSV STRCH STRL (GAUZE/BANDAGES/DRESSINGS) ×2
BNDG COHESIVE 4X5 TAN STRL LF (GAUZE/BANDAGES/DRESSINGS) ×1 IMPLANT
BNDG GAUZE ELAST 4 BULKY (GAUZE/BANDAGES/DRESSINGS) ×2 IMPLANT
BRUSH SCRUB EZ W/ULTRADEX 3%PC (MISCELLANEOUS) ×1 IMPLANT
CHLORAPREP W/TINT 26 (MISCELLANEOUS) ×2 IMPLANT
CLOTH BEACON ORANGE TIMEOUT ST (SAFETY) ×2 IMPLANT
COVER LIGHT HANDLE STERIS (MISCELLANEOUS) ×4 IMPLANT
COVER MAYO STAND XLG (MISCELLANEOUS) ×2 IMPLANT
COVER PERINEAL POST (MISCELLANEOUS) ×2 IMPLANT
DECANTER SPIKE VIAL GLASS SM (MISCELLANEOUS) ×2 IMPLANT
DRAPE INCISE IOBAN 66X45 STRL (DRAPES) ×1 IMPLANT
DRAPE ORTHO 2.5IN SPLIT 77X108 (DRAPES) ×2 IMPLANT
DRAPE ORTHO SPLIT 77X108 STRL (DRAPES) ×4
DRAPE STERI IOBAN 125X83 (DRAPES) ×1 IMPLANT
DRESSING AQUACEL AG ADV 3.5X12 (MISCELLANEOUS) ×1 IMPLANT
DRSG AQUACEL AG ADV 3.5X12 (MISCELLANEOUS) ×2
DRSG MEPILEX SACRM 8.7X9.8 (GAUZE/BANDAGES/DRESSINGS) ×2 IMPLANT
DRSG PAD ABDOMINAL 8X10 ST (GAUZE/BANDAGES/DRESSINGS) ×2 IMPLANT
DRSG TEGADERM 2-3/8X2-3/4 SM (GAUZE/BANDAGES/DRESSINGS) ×4 IMPLANT
DRSG TEGADERM 4X4.75 (GAUZE/BANDAGES/DRESSINGS) ×8 IMPLANT
ELECT REM PT RETURN 9FT ADLT (ELECTROSURGICAL) ×2
ELECTRODE REM PT RTRN 9FT ADLT (ELECTROSURGICAL) ×2 IMPLANT
GAUZE SPONGE 4X4 12PLY STRL (GAUZE/BANDAGES/DRESSINGS) ×2 IMPLANT
GAUZE XEROFORM 1X8 LF (GAUZE/BANDAGES/DRESSINGS) ×3 IMPLANT
GLOVE BIO SURGEON STRL SZ7 (GLOVE) ×1 IMPLANT
GLOVE BIO SURGEON STRL SZ8 (GLOVE) ×6 IMPLANT
GLOVE BIOGEL PI IND STRL 6 (GLOVE) ×1 IMPLANT
GLOVE BIOGEL PI IND STRL 6.5 (GLOVE) ×1 IMPLANT
GLOVE BIOGEL PI IND STRL 7.0 (GLOVE) ×5 IMPLANT
GLOVE SRG 8 PF TXTR STRL LF DI (GLOVE) ×2 IMPLANT
GLOVE SURG UNDER POLY LF SZ8 (GLOVE) ×2
GOWN STRL REUS W/ TWL XL LVL3 (GOWN DISPOSABLE) ×2 IMPLANT
GOWN STRL REUS W/TWL LRG LVL3 (GOWN DISPOSABLE) ×5 IMPLANT
GOWN STRL REUS W/TWL XL LVL3 (GOWN DISPOSABLE) ×2
GUIDEROD T2 3X1000 (ROD) IMPLANT
HANDPIECE INTERPULSE COAX TIP (DISPOSABLE) ×2
HEAD BIPOLAR PROS AML 44 (Hips) ×1 IMPLANT
HEAD FEM STD 28X+1.5 STRL (Hips) ×1 IMPLANT
INST SET MAJOR BONE (KITS) ×2 IMPLANT
IV NS IRRIG 3000ML ARTHROMATIC (IV SOLUTION) ×1 IMPLANT
KIT BLADEGUARD II DBL (SET/KITS/TRAYS/PACK) ×2 IMPLANT
KIT TURNOVER CYSTO (KITS) ×2 IMPLANT
MANIFOLD NEPTUNE II (INSTRUMENTS) ×2 IMPLANT
MARKER SKIN DUAL TIP RULER LAB (MISCELLANEOUS) ×2 IMPLANT
NDL HYPO 21X1.5 SAFETY (NEEDLE) ×1 IMPLANT
NDL MA TROC 1/2 (NEEDLE) IMPLANT
NEEDLE HYPO 21X1.5 SAFETY (NEEDLE) ×2 IMPLANT
NEEDLE MA TROC 1/2 (NEEDLE) ×2 IMPLANT
NS IRRIG 1000ML POUR BTL (IV SOLUTION) ×2 IMPLANT
PACK BASIC III (CUSTOM PROCEDURE TRAY) ×2
PACK SRG BSC III STRL LF ECLPS (CUSTOM PROCEDURE TRAY) ×2 IMPLANT
PAD ARMBOARD 7.5X6 YLW CONV (MISCELLANEOUS) ×2 IMPLANT
PENCIL SMOKE EVACUATOR COATED (MISCELLANEOUS) ×2 IMPLANT
PILLOW ABDUCTION MEDIUM (MISCELLANEOUS) ×1 IMPLANT
SET BASIN LINEN APH (SET/KITS/TRAYS/PACK) ×2 IMPLANT
SET HNDPC FAN SPRY TIP SCT (DISPOSABLE) ×1 IMPLANT
SPONGE T-LAP 18X18 ~~LOC~~+RFID (SPONGE) ×4 IMPLANT
STAPLER VISISTAT (STAPLE) ×1 IMPLANT
STEM SUMMIT BASIC PRESSFIT SZ3 (Hips) ×1 IMPLANT
STOCKINETTE IMPERVIOUS LG (DRAPES) ×1 IMPLANT
STRIP CLOSURE SKIN 1/2X4 (GAUZE/BANDAGES/DRESSINGS) IMPLANT
SUMMIT BASIC PRESSFIT SZ3 (Hips) ×2 IMPLANT
SUT MNCRL AB 4-0 PS2 18 (SUTURE) ×1 IMPLANT
SUT MON AB 2-0 CT1 36 (SUTURE) ×4 IMPLANT
SUT VIC AB 0 CT1 27 (SUTURE) ×2
SUT VIC AB 0 CT1 27XBRD ANTBC (SUTURE) ×2 IMPLANT
SUT VIC AB 1 CT1 27 (SUTURE) ×8
SUT VIC AB 1 CT1 27XBRD ANTBC (SUTURE) ×4 IMPLANT
SYR 30ML LL (SYRINGE) ×2 IMPLANT
SYR BULB IRRIG 60ML STRL (SYRINGE) ×4 IMPLANT
TRAY FOLEY MTR SLVR 16FR STAT (SET/KITS/TRAYS/PACK) ×2 IMPLANT
YANKAUER SUCT 12FT TUBE ARGYLE (SUCTIONS) ×1 IMPLANT
YANKAUER SUCT BULB TIP 10FT TU (MISCELLANEOUS) ×2 IMPLANT

## 2023-05-04 NOTE — Consult Note (Signed)
ORTHOPAEDIC CONSULTATION  REQUESTING PHYSICIAN: Tat, Onalee Hua, MD  ASSESSMENT AND PLAN: 82 y.o. female with the following: Right Hip Intertrochanteric femur fracture  This patient requires inpatient admission to the hospitalist, to include preoperative clearance and perioperative medical management  - Weight Bearing Status/Activity: NWB Right lower extremity  - Additional recommended labs/tests: Preop Labs: CBC, BMP, PT/INR, Chest XR, and EKG  -VTE Prophylaxis: Please hold prior to OR; to resume POD#1 at the discretion of the primary team  - Pain control: Recommend PO pain medications PRN; judicious use of narcotics  - Follow-up plan: F/u 10-14 days postop  -Procedures: Plan for OR once patient has been medically optimized  Plan for Right Hip Cephalomedullary nail   OR 05/04/23  Start NS prior to surgery  Chief Complaint: Right hip pain  HPI: Traci Koch is a 82 y.o. female who presented to the ED for evaluation after sustaining a mechanical fall.  She states she turned and fell in her kitchen while getting ready for church.  She landed on her right hip.  No pain elsewhere.  She did not hit her head.  No numbness or tingling.   She went to a local urgent care facility and then family brought her to [facility]. She is comfortable when she is not moving.  Pain is worsened with movement.   She is not on blood thinners.   Past Medical History:  Diagnosis Date   Diabetes mellitus without complication (HCC)    Hypertension    Thyroid disease    History reviewed. No pertinent surgical history. Social History   Socioeconomic History   Marital status: Unknown    Spouse name: Not on file   Number of children: Not on file   Years of education: Not on file   Highest education level: Not on file  Occupational History   Not on file  Tobacco Use   Smoking status: Never   Smokeless tobacco: Never  Vaping Use   Vaping Use: Never used  Substance and Sexual Activity   Alcohol use: Never    Drug use: Never   Sexual activity: Not Currently  Other Topics Concern   Not on file  Social History Narrative   Not on file   Social Determinants of Health   Financial Resource Strain: Not on file  Food Insecurity: No Food Insecurity (05/04/2023)   Hunger Vital Sign    Worried About Running Out of Food in the Last Year: Never true    Ran Out of Food in the Last Year: Never true  Transportation Needs: No Transportation Needs (05/04/2023)   PRAPARE - Administrator, Civil Service (Medical): No    Lack of Transportation (Non-Medical): No  Physical Activity: Not on file  Stress: Not on file  Social Connections: Not on file   History reviewed. No pertinent family history. Allergies  Allergen Reactions   Codeine Other (See Comments)    Hallucinations    Penicillins     Swelling    Tetanus-Diphth-Acell Pertussis Swelling   Prior to Admission medications   Medication Sig Start Date End Date Taking? Authorizing Provider  cholecalciferol (VITAMIN D3) 25 MCG (1000 UNIT) tablet Take 1,000 Units by mouth daily.   Yes [provider]  Cinnamon 500 MG capsule Take by mouth. 02/09/18  Yes [provider]  empagliflozin (JARDIANCE) 25 MG TABS tablet Take 25 mg by mouth daily.   Yes [provider]  ibuprofen (ADVIL) 200 MG tablet Take 800 mg by mouth every  8 (eight) hours as needed for moderate pain.   Yes [provider]  levothyroxine (SYNTHROID) 25 MCG tablet Take 1 tablet by mouth every morning. 03/25/13  Yes [provider]  metFORMIN (GLUCOPHAGE) 500 MG tablet Take 500 mg by mouth at bedtime as needed (low blood sugar). 12/31/04  Yes [provider]  Multiple Vitamins-Minerals (PRESERVISION AREDS 2) CAPS Take 1 tablet by mouth 2 (two) times daily.   Yes [provider]  olmesartan (BENICAR) 40 MG tablet Take 1 tablet by mouth daily. 02/09/18  Yes [provider]  Specialty Vitamins Products (BIOTIN PLUS KERATIN)  10000-100 MCG-MG TABS Take 1 tablet by mouth daily.   Yes [provider]   DG Chest 1 View  Result Date: 05/03/2023 CLINICAL DATA:  Larey Seat, right hip fracture EXAM: CHEST  1 VIEW COMPARISON:  None Available. FINDINGS: Single frontal view of the chest demonstrates mild enlargement of the cardiac silhouette. There is central pulmonary vascular congestion, without airspace disease, effusion, or pneumothorax. No acute bony abnormalities. IMPRESSION: 1. Enlarged cardiac silhouette, with central pulmonary vascular congestion. No overt edema. Electronically Signed   By: Sharlet Salina M.D.   On: 05/03/2023 15:04   DG Hip Unilat W or Wo Pelvis 2-3 Views Right  Result Date: 05/03/2023 CLINICAL DATA:  Larey Seat, fracture EXAM: DG HIP (WITH OR WITHOUT PELVIS) 2-3V RIGHT COMPARISON:  None Available. FINDINGS: Frontal view of the pelvis as well as frontal and cross-table lateral views of the right hip are obtained. There is a comminuted intertrochanteric right hip fracture with marked impaction and varus angulation at the fracture site. No dislocation. Symmetrical bilateral hip osteoarthritis. Remainder of the bony pelvis is unremarkable. IMPRESSION: 1. Acute comminuted intertrochanteric right hip fracture, with impaction and varus angulation at the fracture site. Electronically Signed   By: Sharlet Salina M.D.   On: 05/03/2023 15:03   Family History Reviewed and non-contributory, no pertinent history of problems with bleeding or anesthesia    Review of Systems No fevers or chills No numbness or tingling No chest pain No shortness of breath No bowel or bladder dysfunction No GI distress No headaches    OBJECTIVE  Vitals:Patient Vitals for the past 8 hrs:  BP Temp Temp src Pulse Resp SpO2  05/04/23 0622 (!) 179/67 -- -- 67 -- --  05/04/23 0621 (!) 179/67 -- -- -- -- --  05/04/23 0541 (!) 183/77 98.3 F (36.8 C) Oral 70 20 97 %  05/04/23 0003 (!) 165/69 98.4 F (36.9 C) Oral 68 20 96 %  05/03/23  2333 (!) 151/64 -- -- -- -- --   General: Alert, no acute distress Cardiovascular: Extremities are warm Respiratory: No cyanosis, no use of accessory musculature Skin: No lesions in the area of chief complaint  Neurologic: Sensation intact distally  Psychiatric: Patient is competent for consent with normal mood and affect Lymphatic: No swelling obvious and reported other than the area involved in the exam below Extremities  RLE: Extremity held in a fixed position.  ROM deferred due to known fracture.  Sensation is intact distally in the sural, saphenous, DP, SP, and plantar nerve distribution. 2+ DP pulse.  Toes are WWP.  Active motion intact in the TA/EHL/GS. LLE: Sensation is intact distally in the sural, saphenous, DP, SP, and plantar nerve distribution. 2+ DP pulse.  Toes are WWP.  Active motion intact in the TA/EHL/GS. Tolerates gentle ROM of the hip.  No pain with axial loading.     Test Results Imaging XR of  the Right hip demonstrates a Intertrochanteric femur fracture.  Labs cbc Recent Labs    05/03/23 1406 05/04/23 0500  WBC 10.8* 6.8  HGB 14.3 13.1  HCT 45.1 40.9  PLT 227 189     Recent Labs    05/03/23 1406 05/04/23 0500  NA 139 138  K 3.6 3.7  CL 104 105  CO2 24 25  GLUCOSE 201* 150*  BUN 17 16  CREATININE 0.82 0.75  CALCIUM 9.0 8.6*

## 2023-05-04 NOTE — Progress Notes (Addendum)
Patient fell and sustained a right hip fracture.  Initially, fracture was described as an intertrochanteric femur fracture.  As such, we planned to proceed with a cephalomedullary nail.  Procedure was discussed in detail with the patient.  Surgical consent was finalized.  We positioned the patient on the fracture table in the operating room.  We then started with a reduction maneuver, as well as appropriate x-rays.  At this point, it was noted that she had sustained a displaced femoral neck fracture.  Due to the varus angulation, the most appropriate procedure would be a right hip hemiarthroplasty.  The procedure was discussed in great detail with the patient's son and daughter.  All questions were answered.  Risks and benefits including infection, bleeding, need for further surgery, dislocation, persistent pain and more severe complications associated with anesthesia were discussed.  The family had no questions.  They are comfortable with the plan to proceed with a right hip hemiarthroplasty.    Desire Fulp A. Dallas Schimke, MD MS [facility] [address] [place],  [place]  [address] Phone: [phone] Fax: [phone]

## 2023-05-04 NOTE — Progress Notes (Signed)
CHG bath done x2, 2x PIV 20Ga in place and working, IS instructions given pt able to reach pre-op, NPO since midnight only PO meds given with a sip of water. Family and pt updated surgery is scheduled for 0940.

## 2023-05-04 NOTE — Progress Notes (Signed)
PROGRESS NOTE  Traci Koch ZOX:[id number] DOB: 15-Jun-1941 DOA: 05/03/2023 PCP: Rebekah Chesterfield, NP  Brief History:  82 year old female with history of hypertension, diabetes mellitus type 2, hypothyroidism presenting with mechanical fall.  The patient was getting ready for church when she got her pant leg caught on a dresser drawer.  This caused her to fall onto her right side.  She was having difficulty getting up secondary to pain.  She denies loss of consciousness or hitting her head.  The patient had been in her usual state of health without any complaints. Patient denies fevers, chills, headache, chest pain, dyspnea, nausea, vomiting, diarrhea, abdominal pain, dysuria, hematuria, hematochezia, and melena. In the ED, the patient was afebrile and hemodynamically stable with oxygen saturation 98% room air.  WBC 10.8, hemoglobin 14.3, platelets 227,000.  Sodium 139, potassium 3.6, bicarbonate 24, serum creatinine 0.82.  LFTs were unremarkable.  EKG showed sinus rhythm with no ST-T wave changes.  Chest x-ray showed slight increase in interstitial markings.  Orthopedics, Dr. Dallas Schimke was consulted and plans for operative intervention on 05/03/2022.  Assessment/Plan: Right Intertrochanteric hip fracture -Ortho--Dr. Dallas Schimke consulted -05/04/23--plan for operative intervention -at baseline pt is completely independent with ADLS -able to function >4 METs  Essential hypertension -Resume ARB once able to take p.o. -Hydralazine prn SBP >170  Diabetes mellitus type 2, controlled -Holding Jardiance and metformin -NovoLog sliding scale  Hypothyroidism -Continue Synthroid       Family Communication:  no Family at bedside  Consultants:  ortho--[place]  Code Status:  FULL  DVT Prophylaxis:  [place] Lovenox to start on POD#1   Procedures: As Listed in Progress Note Above  Antibiotics: None     Subjective:  She complains of right hip pain.  Otherwise, .Patient denies fevers,  chills, headache, chest pain, dyspnea, nausea, vomiting, diarrhea, abdominal pain, dysuria, hematuria, hematochezia, and melena.   Objective: Vitals:   05/04/23 0003 05/04/23 0541 05/04/23 0621 05/04/23 0622  BP: (!) 165/69 (!) 183/77 (!) 179/67 (!) 179/67  Pulse: 68 70  67  Resp: 20 20    Temp: 98.4 F (36.9 C) 98.3 F (36.8 C)    TempSrc: Oral Oral    SpO2: 96% 97%    Weight:      Height:        Intake/Output Summary (Last 24 hours) at 05/04/2023 0714 Last data filed at 05/04/2023 0129 Gross per 24 hour  Intake 46.34 ml  Output --  Net 46.34 ml   Weight change:  Exam:  General:  Pt is alert, follows commands appropriately, not in acute distress HEENT: No icterus, No thrush, No neck mass, [place]/AT Cardiovascular: RRR, S1/S2, no rubs, no gallops Respiratory: CTA bilaterally, no wheezing, no crackles, no rhonchi Abdomen: Soft/+BS, non tender, non distended, no guarding Extremities: No edema, No lymphangitis, No petechiae, No rashes, no synovitis   Data Reviewed: I have personally reviewed following labs and imaging studies Basic Metabolic Panel: Recent Labs  Lab 05/03/23 1406 05/04/23 0500  NA 139 138  K 3.6 3.7  CL 104 105  CO2 24 25  GLUCOSE 201* 150*  BUN 17 16  CREATININE 0.82 0.75  CALCIUM 9.0 8.6*   Liver Function Tests: Recent Labs  Lab 05/03/23 1406 05/04/23 0500  AST 22 16  ALT 18 14  ALKPHOS 71 60  BILITOT 0.7 1.1  PROT 6.6 5.9*  ALBUMIN 3.8 3.2*   No results for input(s): "LIPASE", "AMYLASE" in the last 168 hours.  No results for input(s): "AMMONIA" in the last 168 hours. Coagulation Profile: No results for input(s): "INR", "PROTIME" in the last 168 hours. CBC: Recent Labs  Lab 05/03/23 1406 05/04/23 0500  WBC 10.8* 6.8  NEUTROABS 9.6*  --   HGB 14.3 13.1  HCT 45.1 40.9  MCV 89.7 90.3  PLT 227 189   Cardiac Enzymes: No results for input(s): "CKTOTAL", "CKMB", "CKMBINDEX", "TROPONINI" in the last 168 hours. BNP: Invalid input(s):  "POCBNP" CBG: Recent Labs  Lab 05/03/23 1712 05/04/23 0005 05/04/23 0612  GLUCAP 155* 135* 168*   HbA1C: No results for input(s): "HGBA1C" in the last 72 hours. Urine analysis: No results found for: "COLORURINE", "APPEARANCEUR", "LABSPEC", "PHURINE", "GLUCOSEU", "HGBUR", "BILIRUBINUR", "KETONESUR", "PROTEINUR", "UROBILINOGEN", "NITRITE", "LEUKOCYTESUR" Sepsis Labs: @LABRCNTIP (procalcitonin:4,lacticidven:4) ) Recent Results (from the past 240 hour(s))  MRSA Next Gen by PCR, Nasal     Status: None   Collection Time: 05/03/23 10:40 PM   Specimen: Nasal Mucosa; Nasal Swab  Result Value Ref Range Status   MRSA by PCR Next Gen NOT DETECTED NOT DETECTED Final    Comment: (NOTE) The GeneXpert MRSA Assay (FDA approved for NASAL specimens only), is one component of a comprehensive MRSA colonization surveillance program. It is not intended to diagnose MRSA infection nor to guide or monitor treatment for MRSA infections. Test performance is not FDA approved in patients less than 27 years old. Performed at [facility], [address], [place], [place] [address]      Scheduled Meds:  Chlorhexidine Gluconate Cloth  6 each Topical Daily   insulin aspart  0-9 Units Subcutaneous TID WC   irbesartan  300 mg Oral Daily   levothyroxine  25 mcg Oral q morning   Continuous Infusions:  clindamycin (CLEOCIN) IV     methocarbamol (ROBAXIN) IV Stopped (05/03/23 2351)   tranexamic acid      Procedures/Studies: DG Chest 1 View  Result Date: 05/03/2023 CLINICAL DATA:  Larey Seat, right hip fracture EXAM: CHEST  1 VIEW COMPARISON:  None Available. FINDINGS: Single frontal view of the chest demonstrates mild enlargement of the cardiac silhouette. There is central pulmonary vascular congestion, without airspace disease, effusion, or pneumothorax. No acute bony abnormalities. IMPRESSION: 1. Enlarged cardiac silhouette, with central pulmonary vascular congestion. No overt edema. Electronically Signed   By:  Sharlet Salina M.D.   On: 05/03/2023 15:04   DG Hip Unilat W or Wo Pelvis 2-3 Views Right  Result Date: 05/03/2023 CLINICAL DATA:  Larey Seat, fracture EXAM: DG HIP (WITH OR WITHOUT PELVIS) 2-3V RIGHT COMPARISON:  None Available. FINDINGS: Frontal view of the pelvis as well as frontal and cross-table lateral views of the right hip are obtained. There is a comminuted intertrochanteric right hip fracture with marked impaction and varus angulation at the fracture site. No dislocation. Symmetrical bilateral hip osteoarthritis. Remainder of the bony pelvis is unremarkable. IMPRESSION: 1. Acute comminuted intertrochanteric right hip fracture, with impaction and varus angulation at the fracture site. Electronically Signed   By: Sharlet Salina M.D.   On: 05/03/2023 15:03    Catarina Hartshorn, DO  [facility]  If 7PM-7AM, please contact night-coverage [email or web address] Password [email or web address] 05/04/2023, 7:14 AM   LOS: 1 day

## 2023-05-04 NOTE — Anesthesia Procedure Notes (Addendum)
Procedure Name: LMA Insertion Date/Time: 05/04/2023 11:02 AM  Performed by: Moshe Salisbury, CRNAPre-anesthesia Checklist: Patient identified, Patient being monitored, Emergency Drugs available, Timeout performed and Suction available Patient Re-evaluated:Patient Re-evaluated prior to induction Oxygen Delivery Method: Circle System Utilized Preoxygenation: Pre-oxygenation with 100% oxygen Induction Type: IV induction Ventilation: Mask ventilation without difficulty LMA: LMA inserted LMA Size: 3.0 Number of attempts: 1 Placement Confirmation: positive ETCO2 and breath sounds checked- equal and bilateral

## 2023-05-04 NOTE — Anesthesia Preprocedure Evaluation (Signed)
Anesthesia Evaluation  Patient identified by MRN, date of birth, ID band Patient awake    Reviewed: Allergy & Precautions, H&P , NPO status , Patient's Chart, lab work & pertinent test results, reviewed documented beta blocker date and time   Airway Mallampati: II  TM Distance: >3 FB Neck ROM: full    Dental no notable dental hx.    Pulmonary neg pulmonary ROS   Pulmonary exam normal breath sounds clear to auscultation       Cardiovascular Exercise Tolerance: Good hypertension, negative cardio ROS  Rhythm:regular Rate:Normal     Neuro/Psych negative neurological ROS  negative psych ROS   GI/Hepatic negative GI ROS, Neg liver ROS,,,  Endo/Other  negative endocrine ROSdiabetes, Type 2    Renal/GU negative Renal ROS  negative genitourinary   Musculoskeletal   Abdominal   Peds  Hematology negative hematology ROS (+)   Anesthesia Other Findings   Reproductive/Obstetrics negative OB ROS                             Anesthesia Physical Anesthesia Plan  ASA: 2 and emergent  Anesthesia Plan: Spinal   Post-op Pain Management:    Induction:   PONV Risk Score and Plan: Propofol infusion  Airway Management Planned:   Additional Equipment:   Intra-op Plan:   Post-operative Plan:   Informed Consent: I have reviewed the patients History and Physical, chart, labs and discussed the procedure including the risks, benefits and alternatives for the proposed anesthesia with the patient or authorized representative who has indicated his/her understanding and acceptance.     Dental Advisory Given  Plan Discussed with: CRNA  Anesthesia Plan Comments:        Anesthesia Quick Evaluation

## 2023-05-04 NOTE — Transfer of Care (Signed)
Immediate Anesthesia Transfer of Care Note  Patient: Traci Koch  Procedure(s) Performed: ARTHROPLASTY BIPOLAR HIP (HEMIARTHROPLASTY) (Right: Hip)  Patient Location: PACU  Anesthesia Type:General  Level of Consciousness: awake  Airway & Oxygen Therapy: Patient Spontanous Breathing  Post-op Assessment: Report given to RN  Post vital signs: Reviewed and stable  Last Vitals:  Vitals Value Taken Time  BP    Temp    Pulse 90 05/04/23 1359  Resp 20 05/04/23 1359  SpO2 98 % 05/04/23 1359  Vitals shown include unvalidated device data.  Last Pain:  Vitals:   05/04/23 0809  TempSrc: Oral  PainSc: 0-No pain      Patients Stated Pain Goal: 6 (05/04/23 0809)  Complications: No notable events documented.

## 2023-05-04 NOTE — TOC Initial Note (Signed)
Transition of Care [facility]) - Initial/Assessment Note    Patient Details  Name: Traci Koch MRN: [id number] Date of Birth: 1941/09/29  [facility]) CM/SW Contact:    Leitha Bleak, RN Phone Number: 05/04/2023, 2:00 PM  Clinical Narrative:     Patient in OR, admitted with hip fracture. CM spoke with her daughter. Discharge plan is to take her home with home health. She is independent at baseline. Family all lives next door and nearby. They will be with her. TOC submitted referral for home health to Maralyn Sago for [place].  PT eval pending. TOC following to see if patient needs DME.              Expected Discharge Plan: Home w Home Health Services Barriers to Discharge: Continued Medical Work up   Patient Goals and CMS Choice Patient states their goals for this hospitalization and ongoing recovery are:: to go home with home health CMS Medicare.gov Compare Post Acute Care list provided to:: Patient Represenative (must comment) Choice offered to / list presented to : Adult Children     Expected Discharge Plan and Services       Living arrangements for the past 2 months: Single Family Home          HH Agency: [facility] Date [facility] Agency Contacted: 05/04/23 Time HH Agency Contacted: 1359 Representative spoke with at [facility] Agency: Versie Starks)  Prior Living Arrangements/Services Living arrangements for the past 2 months: Single Family Home Lives with:: Self Patient language and need for interpreter reviewed:: Yes Do you feel safe going back to the place where you live?: Yes      Need for Family Participation in Patient Care: Yes (Comment) Care giver support system in place?: Yes (comment)   Criminal Activity/Legal Involvement Pertinent to Current Situation/Hospitalization: No - Comment as needed  Activities of Daily Living Home Assistive Devices/Equipment: None ADL Screening (condition at time of admission) Patient's cognitive ability adequate  to safely complete daily activities?: Yes Is the patient deaf or have difficulty hearing?: No Does the patient have difficulty seeing, even when wearing glasses/contacts?: No Does the patient have difficulty concentrating, remembering, or making decisions?: No Patient able to express need for assistance with ADLs?: Yes Does the patient have difficulty dressing or bathing?: No Independently performs ADLs?: Yes (appropriate for developmental age) Does the patient have difficulty walking or climbing stairs?: No Weakness of Legs: None Weakness of Arms/Hands: None  Permission Sought/Granted            Permission granted to share info w Relationship: Daughter     Emotional Assessment         Alcohol / Substance Use: Not Applicable Psych Involvement: No (comment)  Admission diagnosis:  Hip fracture (HCC) [S72.009A] Closed fracture of right hip, initial encounter [facility]) [S72.001A] Patient Active Problem List   Diagnosis Date Noted   Essential hypertension 05/04/2023   Hip fracture (HCC) 05/03/2023   PCP:  Rebekah Chesterfield, NP Pharmacy:   [id number] - [place], [place] - [address] [facility] [address] [place] [address] Phone: [phone] Fax: [phone]     Social Determinants of Health (SDOH) Social History: SDOH Screenings   Food Insecurity: No Food Insecurity (05/04/2023)  Housing: Low Risk  (05/04/2023)  Transportation Needs: No Transportation Needs (05/04/2023)  Utilities: Not At Risk (05/04/2023)  Tobacco Use: Low Risk  (05/04/2023)   SDOH Interventions:    Readmission Risk Interventions  05/04/2023    1:51 PM  Readmission Risk Prevention Plan  Medication Screening Complete  Transportation Screening Complete

## 2023-05-04 NOTE — Op Note (Signed)
Orthopaedic Surgery Operative Note (CSN: [id number])  Traci Koch  09/12/1941 Date of Surgery: 05/04/2023   Diagnoses:  Displaced right femoral neck fracture  Procedure: Right hip hemiarthroplasty   Operative Finding Successful completion of the planned procedure.  Patient was positioned on the table in preparation for an IM nail.  Reduction maneuver attempted and fluoroscopy demonstrated a displaced femoral neck fracture.  Proceeded with a right hip hemiarthroplasty.   Post-Op Diagnosis: Same Surgeons:Primary: Oliver Barre, MD Assistants: Cecile Sheerer Location: [facility] OR ROOM 1 Anesthesia: General with local anesthesia Antibiotics: Ancef 2 g with local vancomycin powder 1 g at the surgical site Tourniquet time:  N/A Estimated Blood Loss: 200 cc Complications: None Specimens: None  Implants: Implant Name Type Inv. Item Serial No. Manufacturer Lot No. LRB No. Used Action  [employer] SZ3 - [id number] Hips [employer] SZ3  [employer] [id number] Right 1 Implanted  BIPOLAR PROS AML 44 - [id number] Hips BIPOLAR PROS AML 44  DEPUY ORTH[employer] [id number] Right 1 Implanted  HEAD FEM STD 28X+1.5 STRL - [id number] Hips HEAD FEM STD 28X+1.5 [name] ORTH[employer] [id number] Right 1 Implanted    Indications for Surgery:   Traci Koch is a 82 y.o. female who fell and sustained a right hip fracture.  In order to restroe form and function and allow her to [facility] as soon as possible, she was consented for surgery. Benefits and risks of operative and nonoperative management were discussed prior to surgery with the patient and informed consent form was completed.  Specific risks including infection, need for additional surgery, bleeding, nonunion, malunion, stiffness, persistent pain, dislocation and more severe complications associated with anesthesia.  She elected to proceed.  Surgical consent was finalized.    Procedure:   The patient was identified properly. Informed  consent was obtained and the surgical site was marked. The patient was taken up to suite where general anesthesia was induced.  The patient was positioned supine on a fracture table.  Fluoroscopy was used to identift the fracture and displaced femoral neck fracture was confirmed.  At this point, we needed to transition to hip hemiarthroplasty.  She was then transitioned lateral with posts.  The right leg was prepped and draped in the usual sterile fashion.  Timeout was performed before the beginning of the case.  Ancef dosing was confirmed prior to incision.  Patient received 1 g of TXA before the start of the case  We started by making an incision centered over the greater trochanter with a scalpel. We used the scalpel to continue to dissect to the fascia.  Electrocautery was used to help with hemostasis.  A cobb elevator was used to clear the IT band and then it was pierced with Bovie electrocautery. Mayo scissors were used to cut the fascia in a longitudinal fashion. The gluteus maximus fibers were bluntly split in line with their fibers.   The femur was slowly internally rotated, putting tension on the posterior structures. The short external rotators were identified and tagged with #1 vicryl.  Bovie electrocautery was used to dissect the short external rotators off of the insertion onto the femur.  Next, we identified the capsule and made a T capsulotomy.  The capsule was then tagged with #1 Vicryl sutures.   Next, we visualized the femoral neck fracture. We identified the sciatic nerve by palpation and verified that it was not in danger during the dissection.    We then made a neck cut approximately 1 cm above the  lesser trochanter with a saw. We removed the femoral head. We used the box cutter to cut away some of the greater trochanter for ease of insertion of the stem. We then used the canal finder to locate the femoral canal.   Using the angle of the femoral neck as our guide for version, we  broached sequentially. We trialed components and found appropriate fit and stability.  The patient would come to full extension of the hip. The hip was stable at 90 degrees of flexion, and 45 degrees of internal rotation.  Leg lengths were approximately equal.  We then removed the trials as well as the broach. We ensured there were no foreign bodies or bone within the acetabulum. We copiously irrigated the wound.  We measured and placed an appropriately sized cement restrictor within the canal.  Cement was mixed on the back table and when ready, cement was inserted into the canal and pressurized.  We then carefully placed our stem.  Excess cement was removed immediately.  We maintained pressure on the stem while the cement hardened.  An appropriately sized head was placed on to the stem and the hip was reduced. Again, we noted it was stable in the previously mentioned manipulations.   We repaired the posterior capsule.  Short external rotators repaired to the greater trochanter to bone tunnels. We closed the fascia of the iliotibial band and gluteus maximus with running and intterupted Vicryl sutures. We then closed Scarpa's fascia with Vicryl sutures. Skin was closed with 2-0 monocryl and 4-0 Monocryl with Steri-Strips and an Aquacel dressing was placed.  A hib abduction pillow was secured.  Patient was awoken taken to PACU in stable condition.   Post-operative plan:  The patient will be WBAT on the operative extremity. Hip abduction pillow in place at all times when in bed Posterior hip precautions PT/OT    DVT prophylaxis per primary team, no orthopedic contraindications.    Pain control with PRN pain medication preferring oral medicines.   Follow up plan will be scheduled in approximately 14 days for incision check and XR.

## 2023-05-05 ENCOUNTER — Other Ambulatory Visit (HOSPITAL_COMMUNITY): Payer: Self-pay | Admitting: *Deleted

## 2023-05-05 ENCOUNTER — Inpatient Hospital Stay (HOSPITAL_COMMUNITY): Payer: Medicare Other

## 2023-05-05 DIAGNOSIS — S72001D Fracture of unspecified part of neck of right femur, subsequent encounter for closed fracture with routine healing: Secondary | ICD-10-CM | POA: Diagnosis not present

## 2023-05-05 DIAGNOSIS — I4891 Unspecified atrial fibrillation: Secondary | ICD-10-CM

## 2023-05-05 DIAGNOSIS — I10 Essential (primary) hypertension: Secondary | ICD-10-CM | POA: Diagnosis not present

## 2023-05-05 LAB — BASIC METABOLIC PANEL
Anion gap: 12 (ref 5–15)
BUN: 19 mg/dL (ref 8–23)
CO2: 21 mmol/L — ABNORMAL LOW (ref 22–32)
Calcium: 8.3 mg/dL — ABNORMAL LOW (ref 8.9–10.3)
Chloride: 104 mmol/L (ref 98–111)
Creatinine, Ser: 0.87 mg/dL (ref 0.44–1.00)
GFR, Estimated: >60 mL/min (ref >60)
Glucose, Bld: 129 mg/dL — ABNORMAL HIGH (ref 70–99)
Potassium: 3.9 mmol/L (ref 3.5–5.1)
Sodium: 137 mmol/L (ref 135–145)

## 2023-05-05 LAB — ECHOCARDIOGRAM COMPLETE
AR max vel: 1.91 cm2
AV Area VTI: 1.98 cm2
AV Area mean vel: 1.92 cm2
AV Mean grad: 6.9 mmHg
AV Peak grad: 13.2 mmHg
Ao pk vel: 1.82 m/s
Area-P 1/2: 2.56 cm2
Height: 63 in
MV M vel: 1.79 m/s
MV Peak grad: 12.8 mmHg
S' Lateral: 2.9 cm
Weight: 2799.98 oz

## 2023-05-05 LAB — CBC
HCT: 37.6 % (ref 36.0–46.0)
Hemoglobin: 11.8 g/dL — ABNORMAL LOW (ref 12.0–15.0)
MCH: 29.3 pg (ref 26.0–34.0)
MCHC: 31.4 g/dL (ref 30.0–36.0)
MCV: 93.3 fL (ref 80.0–100.0)
Platelets: 197 10*3/uL (ref 150–400)
RBC: 4.03 MIL/uL (ref 3.87–5.11)
RDW: 15.3 % (ref 11.5–15.5)
WBC: 9.5 10*3/uL (ref 4.0–10.5)
nRBC: 0 % (ref 0.0–0.2)

## 2023-05-05 LAB — GLUCOSE, CAPILLARY
Glucose-Capillary: 135 mg/dL — ABNORMAL HIGH (ref 70–99)
Glucose-Capillary: 121 mg/dL — ABNORMAL HIGH (ref 70–99)
Glucose-Capillary: 135 mg/dL — ABNORMAL HIGH (ref 70–99)
Glucose-Capillary: 148 mg/dL — ABNORMAL HIGH (ref 70–99)
Glucose-Capillary: 190 mg/dL — ABNORMAL HIGH (ref 70–99)
Glucose-Capillary: 166 mg/dL — ABNORMAL HIGH (ref 70–99)

## 2023-05-05 LAB — HEMOGLOBIN A1C
Hgb A1c MFr Bld: 7.2 % — ABNORMAL HIGH (ref 4.8–5.6)
Mean Plasma Glucose: 159.94 mg/dL

## 2023-05-05 MED ORDER — DILTIAZEM LOAD VIA INFUSION
10.0000 mg | Freq: Once | INTRAVENOUS | Status: AC
  Administered 2023-05-05: 10 mg via INTRAVENOUS
  Filled 2023-05-05: qty 10

## 2023-05-05 MED ORDER — DILTIAZEM HCL-DEXTROSE 125-5 MG/125ML-% IV SOLN (PREMIX)
5.0000 mg/h | INTRAVENOUS | Status: DC
  Administered 2023-05-05 (×2): 5 mg/h via INTRAVENOUS
  Filled 2023-05-05 (×3): qty 125

## 2023-05-05 NOTE — Progress Notes (Signed)
   ORTHOPAEDIC PROGRESS NOTE  s/p Procedure(s): Right hip hemiarthroplasty  DOS: 05/04/23  SUBJECTIVE: No issues over night.  Complaining of pain this morning.  No questions.  Family not at the bedside.   OBJECTIVE: PE:  Alert and oriented, no acute distress Hip abduction pillow in place Dressing is clean, dry and intact Toes are [facility] Sensation intact to dorsum of foot Active motion intact to ankle and great toe 2+ DP Pulse  Vitals:   05/05/23 0511 05/05/23 0828  BP: 129/82 (!) 132/91  Pulse: 88 (!) 142  Resp: 20 20  Temp: 98.6 F (37 C) 99.1 F (37.3 C)  SpO2: 92% 92%      Latest Ref Rng & Units 05/05/2023    5:01 AM 05/04/2023    5:00 AM 05/03/2023    2:06 PM  CBC  WBC 4.0 - 10.5 K/uL 9.5  6.8  10.8   Hemoglobin 12.0 - 15.0 g/dL 16.1  09.6  04.5   Hematocrit 36.0 - 46.0 % 37.6  40.9  45.1   Platelets 150 - 400 K/uL 197  189  227      ASSESSMENT: Traci Koch is a 82 y.o. female doing well postoperatively.  Stable.  Hb stable  PLAN: Weightbearing: WBAT RLE; Posterior hip precautions.  Insicional and dressing care: Reinforce dressings as needed Orthopedic device(s):  Hip abduction pillow in place while in bed VTE prophylaxis: Aspirin 81mg  BID  6 weeks recommended; no contraindications Pain control: As needed.  Judicious use of narcotics.  Follow - up plan: 2 weeks.  Staples to be removed around 2 weeks.  Can follow up in 6 weeks if no issues.   PT/OT today Foley out   Contact information:     Issa Kosmicki A. Dallas Schimke, MD MS [facility] [address] [place],  [place]  [address] Phone: [phone] Fax: [phone]

## 2023-05-05 NOTE — Consult Note (Signed)
Cardiology Consultation   Patient ID: Traci Koch MRN: [id number]; DOB: 03/18/1941  Admit date: 05/03/2023 Date of Consult: 05/05/2023  PCP:  Rebekah Chesterfield, NP   [facility] Providers Cardiologist: New    Patient Profile:   Traci Koch is a 83 y.o. female with a hx of DM2, HTN, hypothryoidism who is being seen 05/05/2023 for the evaluation of new onset afib in the perioperative period at the request of Dr Tat.  History of Present Illness:   Traci Koch 82 yo female history of DM2, HTN, hypothryoidims, admitted initially to hospital with fall and hip fracture. She is s/p right hip hemiarthroplasty 05/04/23. Cardiology consulted for new onset of afib 05/05/23 with RVR. Already transferred to stepdown and started on diltiazem drip and heparin by primary team. She reports some mild feelings of heart beating strongly but no severe palpitations.    Admit labs WBC 10.8 Hgb 14.3 Plt 227 K 3.6 Cr 0.82 BUN 17  CXR centra congestion EKG afib RVR, LAFB    Past Medical History:  Diagnosis Date   Diabetes mellitus without complication (HCC)    Hypertension    Thyroid disease     History reviewed. No pertinent surgical history.    Inpatient Medications: Scheduled Meds:  Chlorhexidine Gluconate Cloth  6 each Topical Daily   insulin aspart  0-9 Units Subcutaneous TID WC   levothyroxine  25 mcg Oral q morning   Continuous Infusions:  sodium chloride 75 mL/hr at 05/05/23 0506   diltiazem (CARDIZEM) infusion 5 mg/hr (05/05/23 0950)   methocarbamol (ROBAXIN) IV Stopped (05/03/23 2351)   PRN Meds: acetaminophen **OR** acetaminophen, hydrALAZINE, HYDROmorphone (DILAUDID) injection, methocarbamol (ROBAXIN) IV, ondansetron **OR** ondansetron (ZOFRAN) IV, oxyCODONE  Allergies:    Allergies  Allergen Reactions   Codeine Other (See Comments)    Hallucinations    Penicillins     Swelling    Tetanus-Diphth-Acell Pertussis Swelling    Social History:   Social History    Socioeconomic History   Marital status: Unknown    Spouse name: Not on file   Number of children: Not on file   Years of education: Not on file   Highest education level: Not on file  Occupational History   Not on file  Tobacco Use   Smoking status: Never   Smokeless tobacco: Never  Vaping Use   Vaping Use: Never used  Substance and Sexual Activity   Alcohol use: Never   Drug use: Never   Sexual activity: Not Currently  Other Topics Concern   Not on file  Social History Narrative   Not on file   Social Determinants of Health   Financial Resource Strain: Not on file  Food Insecurity: No Food Insecurity (05/04/2023)   Hunger Vital Sign    Worried About Running Out of Food in the Last Year: Never true    Ran Out of Food in the Last Year: Never true  Transportation Needs: No Transportation Needs (05/04/2023)   PRAPARE - Administrator, Civil Service (Medical): No    Lack of Transportation (Non-Medical): No  Physical Activity: Not on file  Stress: Not on file  Social Connections: Not on file  Intimate Partner Violence: Not At Risk (05/04/2023)   Humiliation, Afraid, Rape, and Kick questionnaire    Fear of Current or Ex-Partner: No    Emotionally Abused: No    Physically Abused: No    Sexually Abused: No    Family History:   History reviewed. No pertinent  family history.   ROS:  Please see the history of present illness.   All other ROS reviewed and negative.     Physical Exam/Data:   Vitals:   05/04/23 2027 05/05/23 0511 05/05/23 0828 05/05/23 0918  BP: (!) 140/56 129/82 (!) 132/91 123/67  Pulse: 86 88 (!) 142 (!) 145  Resp: 20 20 20  (!) 22  Temp: 98.6 F (37 C) 98.6 F (37 C) 99.1 F (37.3 C) 98.6 F (37 C)  TempSrc: Oral Oral Oral Oral  SpO2: 93% 92% 92% 91%  Weight:      Height:        Intake/Output Summary (Last 24 hours) at 05/05/2023 0958 Last data filed at 05/05/2023 0442 Gross per 24 hour  Intake 2415.19 ml  Output 1750 ml  Net 665.19  ml      05/04/2023    8:09 AM 05/03/2023    1:49 PM  Last 3 Weights  Weight (lbs) 175 lb 175 lb  Weight (kg) 79.379 kg 79.379 kg     Body mass index is 31 kg/m.  General:  Well nourished, well developed, in no acute distress HEENT: normal Neck: no JVD Vascular: No carotid bruits; Distal pulses 2+ bilaterally Cardiac:  irreg, tachy Lungs:  clear to auscultation bilaterally, no wheezing, rhonchi or rales  Abd: soft, nontender, no hepatomegaly  Ext: no edema Musculoskeletal:  No deformities, BUE and BLE strength normal and equal Skin: warm and dry  Neuro:  CNs 2-12 intact, no focal abnormalities noted Psych:  Normal affect     Laboratory Data:  High Sensitivity Troponin:  No results for input(s): "TROPONINIHS" in the last 720 hours.   Chemistry Recent Labs  Lab 05/03/23 1406 05/04/23 0500 05/05/23 0501  NA 139 138 137  K 3.6 3.7 3.9  CL 104 105 104  CO2 24 25 21*  GLUCOSE 201* 150* 129*  BUN 17 16 19   CREATININE 0.82 0.75 0.87  CALCIUM 9.0 8.6* 8.3*  GFRNONAA >60 >60 >60  ANIONGAP 11 8 12     Recent Labs  Lab 05/03/23 1406 05/04/23 0500  PROT 6.6 5.9*  ALBUMIN 3.8 3.2*  AST 22 16  ALT 18 14  ALKPHOS 71 60  BILITOT 0.7 1.1   Lipids No results for input(s): "CHOL", "TRIG", "HDL", "LABVLDL", "LDLCALC", "CHOLHDL" in the last 168 hours.  Hematology Recent Labs  Lab 05/03/23 1406 05/04/23 0500 05/05/23 0501  WBC 10.8* 6.8 9.5  RBC 5.03 4.53 4.03  HGB 14.3 13.1 11.8*  HCT 45.1 40.9 37.6  MCV 89.7 90.3 93.3  MCH 28.4 28.9 29.3  MCHC 31.7 32.0 31.4  RDW 14.7 14.9 15.3  PLT 227 189 197   Thyroid No results for input(s): "TSH", "FREET4" in the last 168 hours.  BNPNo results for input(s): "BNP", "PROBNP" in the last 168 hours.  DDimer No results for input(s): "DDIMER" in the last 168 hours.   Radiology/Studies:  DG HIP UNILAT WITH PELVIS 2-3 VIEWS RIGHT  Result Date: 05/04/2023 CLINICAL DATA:  Closed displaced fracture of the right femoral neck. Postop  images. EXAM: DG HIP (WITH OR WITHOUT PELVIS) 2-3V RIGHT COMPARISON:  05/03/2023 FINDINGS: Patient has undergone a right hip arthroplasty. The right femoral stem is completely visualized. Right hip appears to be located and no evidence for a periprosthetic fracture. Lucency in the right hip soft tissues compatible with recent surgery and skin staples are present. No gross abnormality to the left hip. Pelvic bony ring appears to be intact. IMPRESSION: Status post right hip arthroplasty.  No complicating features. Electronically Signed   By: Richarda Overlie M.D.   On: 05/04/2023 14:31   DG HIP UNILAT WITH PELVIS 2-3 VIEWS RIGHT  Result Date: 05/04/2023 CLINICAL DATA:  [id number] Hip fx, right, closed, initial encounter (HCC) [id number] EXAM: DG HIP (WITH OR WITHOUT PELVIS) 2-3V RIGHT COMPARISON:  05/03/2023 FINDINGS: Intraoperative images demonstrated a displaced right femoral neck fracture. IMPRESSION: Right femoral neck fracture on intraoperative images. Electronically Signed   By: Caprice Renshaw M.D.   On: 05/04/2023 14:14   DG C-Arm 1-60 Min-No Report  Result Date: 05/04/2023 Fluoroscopy was utilized by the requesting physician.  No radiographic interpretation.   DG Chest 1 View  Result Date: 05/03/2023 CLINICAL DATA:  Larey Seat, right hip fracture EXAM: CHEST  1 VIEW COMPARISON:  None Available. FINDINGS: Single frontal view of the chest demonstrates mild enlargement of the cardiac silhouette. There is central pulmonary vascular congestion, without airspace disease, effusion, or pneumothorax. No acute bony abnormalities. IMPRESSION: 1. Enlarged cardiac silhouette, with central pulmonary vascular congestion. No overt edema. Electronically Signed   By: Sharlet Salina M.D.   On: 05/03/2023 15:04   DG Hip Unilat W or Wo Pelvis 2-3 Views Right  Result Date: 05/03/2023 CLINICAL DATA:  Larey Seat, fracture EXAM: DG HIP (WITH OR WITHOUT PELVIS) 2-3V RIGHT COMPARISON:  None Available. FINDINGS: Frontal view of the pelvis as well as  frontal and cross-table lateral views of the right hip are obtained. There is a comminuted intertrochanteric right hip fracture with marked impaction and varus angulation at the fracture site. No dislocation. Symmetrical bilateral hip osteoarthritis. Remainder of the bony pelvis is unremarkable. IMPRESSION: 1. Acute comminuted intertrochanteric right hip fracture, with impaction and varus angulation at the fracture site. Electronically Signed   By: Sharlet Salina M.D.   On: 05/03/2023 15:03     Assessment and Plan:   1.New onset afib, perioperative period - new onset afib this admit 1 day postop hip surgery - transferred to stepdown, started on dilt gtt and heparin gtt by primary team - CHADS2Vasc score is 5 (HTN, age x 2, DM2, gender) - may be isolated to periop period. At outpatient f/u would plan for monitor. May not require long term medical therapy. From discussion with primary team ortho was ok with IV heparin at this time.  - echo pending.   - likely can transition to oral av nodal agent and DOAC tomorrow.    2. Hip fracture s/p surgery - postop management per primary team and ortho  For questions or updates, please contact  [facility] Please consult [email or web address] for contact info under    Signed, Dina Rich, MD  05/05/2023 9:58 AM

## 2023-05-05 NOTE — Progress Notes (Signed)
PT Cancellation Note  Patient Details Name: Traci Koch MRN: [id number] DOB: 11/14/1941   Cancelled Treatment:    Reason Eval/Treat Not Completed: Medical issues which prohibited therapy.  Patient transferred to a higher level of care and will need new PT consult to resume therapy when patient is medically stable.  Thank you.    1:37 PM, 05/05/23 Ocie Bob, MPT Physical Therapist with [facility] [phone] office [phone] mobile phone

## 2023-05-05 NOTE — Progress Notes (Signed)
This RN obtained patient VS and noted that HR was 140's. Patient is alert and oriented. And complains of no chest pain or shortness of breath. She stated that she can feel her heart beating fast. Notified Dory Larsen, RN, Dr. Jinny Blossom, and Dr. Arbutus Leas. EKG done. See new orders    05/05/23 0828  Assess: MEWS Score  Temp 99.1 F (37.3 C)  BP (!) 132/91  MAP (mmHg) 100  Pulse Rate (!) 142  Resp 20  SpO2 92 %  O2 Device Room Air  Assess: MEWS Score  MEWS Temp 0  MEWS Systolic 0  MEWS Pulse 3  MEWS RR 0  MEWS LOC 0  MEWS Score 3  MEWS Score Color Yellow  Assess: if the MEWS score is Yellow or Red  Were vital signs taken at a resting state? Yes  Focused Assessment No change from prior assessment  Does the patient meet 2 or more of the SIRS criteria? No  MEWS guidelines implemented  Yes, yellow  Treat  MEWS Interventions Considered administering scheduled or prn medications/treatments as ordered  Take Vital Signs  Increase Vital Sign Frequency  Yellow: Q2hr x1, continue Q4hrs until patient remains green for 12hrs  Escalate  MEWS: Escalate Yellow: Discuss with charge nurse and consider notifying provider and/or RRT  Notify: Charge Nurse/RN  Name of Charge Nurse/RN Notified Dory Larsen, RN  Provider Notification  Provider Name/Title Dr. Arbutus Leas  Date Provider Notified 05/05/23  Time Provider Notified 0825  Method of Notification Page  Notification Reason Other (Comment) (yellow mews)  Provider response At bedside;See new orders  Date of Provider Response 05/05/23  Time of Provider Response 0843  Assess: SIRS CRITERIA  SIRS Temperature  0  SIRS Pulse 1  SIRS Respirations  0  SIRS WBC 0  SIRS Score Sum  1

## 2023-05-05 NOTE — Progress Notes (Signed)
OT Cancellation Note  Patient Details Name: Traci Koch MRN: [id number] DOB: [birth year]   Cancelled Treatment:    Reason Eval/Treat Not Completed: Medical issues which prohibited therapy. Pt's MD requested a hold on the pt due to a-fib issues. Will attempt evaluation at a later time when/if pt is medically appropriate.   Onya Eutsler OT, MOT   Danie Chandler 05/05/2023, 8:43 AM

## 2023-05-05 NOTE — Progress Notes (Signed)
  Echocardiogram 2D Echocardiogram has been performed.  Traci Koch 05/05/2023, 2:48 PM

## 2023-05-05 NOTE — Progress Notes (Signed)
12- lead EKG performed as monitor showed break from A-fib into NSR. EKG showed NSR with 1st degree AVB. Dr. Arbutus Leas made aware, stated to continue Cardizem gtt for now. Will continue to monitor.

## 2023-05-05 NOTE — Anesthesia Postprocedure Evaluation (Signed)
Anesthesia Post Note  Patient: Ryia Cusic  Procedure(s) Performed: ARTHROPLASTY BIPOLAR HIP (HEMIARTHROPLASTY) (Right: Hip)  Patient location during evaluation: Phase II Anesthesia Type: General Level of consciousness: awake Pain management: pain level controlled Vital Signs Assessment: post-procedure vital signs reviewed and stable Respiratory status: spontaneous breathing and respiratory function stable Cardiovascular status: blood pressure returned to baseline and stable Postop Assessment: no headache and no apparent nausea or vomiting Anesthetic complications: no Comments: Late entry   No notable events documented.   Last Vitals:  Vitals:   05/04/23 2027 05/05/23 0511  BP: (!) 140/56 129/82  Pulse: 86 88  Resp: 20 20  Temp: 37 C 37 C  SpO2: 93% 92%    Last Pain:  Vitals:   05/05/23 0511  TempSrc: Oral  PainSc:                  Windell Norfolk

## 2023-05-05 NOTE — Progress Notes (Signed)
PROGRESS NOTE  Gentri Cutillo IRJ:[id number] DOB: [birth year] DOA: 05/03/2023 PCP: Rebekah Chesterfield, NP  Brief History:   82 year old female with history of hypertension, diabetes mellitus type 2, hypothyroidism presenting with mechanical fall.  The patient was getting ready for church when she got her pant leg caught on a dresser drawer.  This caused her to fall onto her right side.  She was having difficulty getting up secondary to pain.  She denies loss of consciousness or hitting her head.  The patient had been in her usual state of health without any complaints. Patient denies fevers, chills, headache, chest pain, dyspnea, nausea, vomiting, diarrhea, abdominal pain, dysuria, hematuria, hematochezia, and melena. In the ED, the patient was afebrile and hemodynamically stable with oxygen saturation 98% room air.  WBC 10.8, hemoglobin 14.3, platelets 227,000.  Sodium 139, potassium 3.6, bicarbonate 24, serum creatinine 0.82.  LFTs were unremarkable.  EKG showed sinus rhythm with no ST-T wave changes.  Chest x-ray showed slight increase in interstitial markings.  Orthopedics, Dr. Dallas Schimke was consulted and patient underwent Right hip hemiarthroplasty.  On 05/05/23 am, patient developed new onset atrial fibrillation with RVR.  Pt states she has no hx of dysrhythmias.  Cardiology was consulted to assist.  Assessment/Plan: Right Intertrochanteric hip fracture -Ortho--Dr. Dallas Schimke consulted -05/04/23--plan for operative intervention -at baseline pt is completely independent with ADLS -able to function >4 METs -PT eval once afib better controlled  New onset Atrial Fibrillation -new onset am 05/05/23 -no hx of dysrhythmias in past -start dilt drip -hold ARB to allow BP margin for diltiazem titration -echo -start IV heparin -cardiology consult -personally reviewed EKG--afib with RVR, nonspecific ST change   Essential hypertension -hold ARB to allow BP margin for diltiazem titration -Hydralazine  prn SBP >170   Diabetes mellitus type 2, controlled -Holding Jardiance and metformin -NovoLog sliding scale   Hypothyroidism -Continue Synthroid             Family Communication:  no Family at bedside   Consultants:  ortho--[place]; cardiology   Code Status:  FULL   DVT Prophylaxis:  [place] Lovenox to start on POD#1     Procedures: As Listed in Progress Note Above   Antibiotics: None              Subjective: Patient denies fevers, chills, headache, chest pain, dyspnea, nausea, vomiting, diarrhea, abdominal pain, dysuria, hematuria, hematochezia, and melena.   Objective: Vitals:   05/04/23 1530 05/04/23 2027 05/05/23 0511 05/05/23 0828  BP: (!) 151/58 (!) 140/56 129/82 (!) 132/91  Pulse: 80 86 88 (!) 142  Resp:  20 20 20   Temp: 98.2 F (36.8 C) 98.6 F (37 C) 98.6 F (37 C) 99.1 F (37.3 C)  TempSrc: Oral Oral Oral Oral  SpO2: 99% 93% 92% 92%  Weight:      Height:        Intake/Output Summary (Last 24 hours) at 05/05/2023 0843 Last data filed at 05/05/2023 0442 Gross per 24 hour  Intake 2415.19 ml  Output 1750 ml  Net 665.19 ml   Weight change: -0.001 kg Exam:  General:  Pt is alert, follows commands appropriately, not in acute distress HEENT: No icterus, No thrush, No neck mass, [place]/AT Cardiovascular: IRRR, S1/S2, no rubs, no gallops Respiratory: fine bibasilar crackles. No wheeze Abdomen: Soft/+BS, non tender, non distended, no guarding Extremities: No edema, No lymphangitis, No petechiae, No rashes, no synovitis   Data Reviewed: I have personally reviewed following  labs and imaging studies Basic Metabolic Panel: Recent Labs  Lab 05/03/23 1406 05/04/23 0500 05/05/23 0501  NA 139 138 137  K 3.6 3.7 3.9  CL 104 105 104  CO2 24 25 21*  GLUCOSE 201* 150* 129*  BUN 17 16 19   CREATININE 0.82 0.75 0.87  CALCIUM 9.0 8.6* 8.3*   Liver Function Tests: Recent Labs  Lab 05/03/23 1406 05/04/23 0500  AST 22 16  ALT 18 14  ALKPHOS 71 60   BILITOT 0.7 1.1  PROT 6.6 5.9*  ALBUMIN 3.8 3.2*   No results for input(s): "LIPASE", "AMYLASE" in the last 168 hours. No results for input(s): "AMMONIA" in the last 168 hours. Coagulation Profile: No results for input(s): "INR", "PROTIME" in the last 168 hours. CBC: Recent Labs  Lab 05/03/23 1406 05/04/23 0500 05/05/23 0501  WBC 10.8* 6.8 9.5  NEUTROABS 9.6*  --   --   HGB 14.3 13.1 11.8*  HCT 45.1 40.9 37.6  MCV 89.7 90.3 93.3  PLT 227 189 197   Cardiac Enzymes: No results for input(s): "CKTOTAL", "CKMB", "CKMBINDEX", "TROPONINI" in the last 168 hours. BNP: Invalid input(s): "POCBNP" CBG: Recent Labs  Lab 05/04/23 1357 05/04/23 1735 05/04/23 2030 05/05/23 0109 05/05/23 0515  GLUCAP 151* 178* 164* 135* 121*   HbA1C: No results for input(s): "HGBA1C" in the last 72 hours. Urine analysis: No results found for: "COLORURINE", "APPEARANCEUR", "LABSPEC", "PHURINE", "GLUCOSEU", "HGBUR", "BILIRUBINUR", "KETONESUR", "PROTEINUR", "UROBILINOGEN", "NITRITE", "LEUKOCYTESUR" Sepsis Labs: @LABRCNTIP (procalcitonin:4,lacticidven:4) ) Recent Results (from the past 240 hour(s))  MRSA Next Gen by PCR, Nasal     Status: None   Collection Time: 05/03/23 10:40 PM   Specimen: Nasal Mucosa; Nasal Swab  Result Value Ref Range Status   MRSA by PCR Next Gen NOT DETECTED NOT DETECTED Final    Comment: (NOTE) The GeneXpert MRSA Assay (FDA approved for NASAL specimens only), is one component of a comprehensive MRSA colonization surveillance program. It is not intended to diagnose MRSA infection nor to guide or monitor treatment for MRSA infections. Test performance is not FDA approved in patients less than 78 years old. Performed at [facility], [address], [place], [place] [address]      Scheduled Meds:  Chlorhexidine Gluconate Cloth  6 each Topical Daily   diltiazem  10 mg Intravenous Once   insulin aspart  0-9 Units Subcutaneous TID WC   irbesartan  300 mg Oral Daily    levothyroxine  25 mcg Oral q morning   Continuous Infusions:  sodium chloride 75 mL/hr at 05/05/23 0506   diltiazem (CARDIZEM) infusion     methocarbamol (ROBAXIN) IV Stopped (05/03/23 2351)    Procedures/Studies: DG HIP UNILAT WITH PELVIS 2-3 VIEWS RIGHT  Result Date: 05/04/2023 CLINICAL DATA:  Closed displaced fracture of the right femoral neck. Postop images. EXAM: DG HIP (WITH OR WITHOUT PELVIS) 2-3V RIGHT COMPARISON:  05/03/2023 FINDINGS: Patient has undergone a right hip arthroplasty. The right femoral stem is completely visualized. Right hip appears to be located and no evidence for a periprosthetic fracture. Lucency in the right hip soft tissues compatible with recent surgery and skin staples are present. No gross abnormality to the left hip. Pelvic bony ring appears to be intact. IMPRESSION: Status post right hip arthroplasty.  No complicating features. Electronically Signed   By: Richarda Overlie M.D.   On: 05/04/2023 14:31   DG HIP UNILAT WITH PELVIS 2-3 VIEWS RIGHT  Result Date: 05/04/2023 CLINICAL DATA:  [id number] Hip fx, right, closed, initial encounter [facility]) [id number]  EXAM: DG HIP (WITH OR WITHOUT PELVIS) 2-3V RIGHT COMPARISON:  05/03/2023 FINDINGS: Intraoperative images demonstrated a displaced right femoral neck fracture. IMPRESSION: Right femoral neck fracture on intraoperative images. Electronically Signed   By: Caprice Renshaw M.D.   On: 05/04/2023 14:14   DG C-Arm 1-60 Min-No Report  Result Date: 05/04/2023 Fluoroscopy was utilized by the requesting physician.  No radiographic interpretation.   DG Chest 1 View  Result Date: 05/03/2023 CLINICAL DATA:  Larey Seat, right hip fracture EXAM: CHEST  1 VIEW COMPARISON:  None Available. FINDINGS: Single frontal view of the chest demonstrates mild enlargement of the cardiac silhouette. There is central pulmonary vascular congestion, without airspace disease, effusion, or pneumothorax. No acute bony abnormalities. IMPRESSION: 1. Enlarged cardiac silhouette,  with central pulmonary vascular congestion. No overt edema. Electronically Signed   By: Sharlet Salina M.D.   On: 05/03/2023 15:04   DG Hip Unilat W or Wo Pelvis 2-3 Views Right  Result Date: 05/03/2023 CLINICAL DATA:  Larey Seat, fracture EXAM: DG HIP (WITH OR WITHOUT PELVIS) 2-3V RIGHT COMPARISON:  None Available. FINDINGS: Frontal view of the pelvis as well as frontal and cross-table lateral views of the right hip are obtained. There is a comminuted intertrochanteric right hip fracture with marked impaction and varus angulation at the fracture site. No dislocation. Symmetrical bilateral hip osteoarthritis. Remainder of the bony pelvis is unremarkable. IMPRESSION: 1. Acute comminuted intertrochanteric right hip fracture, with impaction and varus angulation at the fracture site. Electronically Signed   By: Sharlet Salina M.D.   On: 05/03/2023 15:03    Catarina Hartshorn, DO  [facility]  If 7PM-7AM, please contact night-coverage [email or web address] Password [facility] 05/05/2023, 8:43 AM   LOS: 2 days

## 2023-05-06 ENCOUNTER — Other Ambulatory Visit (HOSPITAL_COMMUNITY): Payer: Self-pay

## 2023-05-06 ENCOUNTER — Telehealth (HOSPITAL_COMMUNITY): Payer: Self-pay | Admitting: Pharmacy Technician

## 2023-05-06 DIAGNOSIS — I480 Paroxysmal atrial fibrillation: Secondary | ICD-10-CM

## 2023-05-06 DIAGNOSIS — S72001S Fracture of unspecified part of neck of right femur, sequela: Secondary | ICD-10-CM

## 2023-05-06 LAB — CBC
HCT: 31.9 % — ABNORMAL LOW (ref 36.0–46.0)
Hemoglobin: 10 g/dL — ABNORMAL LOW (ref 12.0–15.0)
MCH: 29.2 pg (ref 26.0–34.0)
MCHC: 31.3 g/dL (ref 30.0–36.0)
MCV: 93.3 fL (ref 80.0–100.0)
Platelets: 169 10*3/uL (ref 150–400)
RBC: 3.42 MIL/uL — ABNORMAL LOW (ref 3.87–5.11)
RDW: 15.4 % (ref 11.5–15.5)
WBC: 8.6 10*3/uL (ref 4.0–10.5)
nRBC: 0 % (ref 0.0–0.2)

## 2023-05-06 LAB — BASIC METABOLIC PANEL
Anion gap: 12 (ref 5–15)
BUN: 22 mg/dL (ref 8–23)
CO2: 17 mmol/L — ABNORMAL LOW (ref 22–32)
Calcium: 8 mg/dL — ABNORMAL LOW (ref 8.9–10.3)
Chloride: 107 mmol/L (ref 98–111)
Creatinine, Ser: 0.81 mg/dL (ref 0.44–1.00)
GFR, Estimated: >60 mL/min (ref >60)
Glucose, Bld: 139 mg/dL — ABNORMAL HIGH (ref 70–99)
Potassium: 3.9 mmol/L (ref 3.5–5.1)
Sodium: 136 mmol/L (ref 135–145)

## 2023-05-06 LAB — GLUCOSE, CAPILLARY
Glucose-Capillary: 160 mg/dL — ABNORMAL HIGH (ref 70–99)
Glucose-Capillary: 167 mg/dL — ABNORMAL HIGH (ref 70–99)
Glucose-Capillary: 206 mg/dL — ABNORMAL HIGH (ref 70–99)
Glucose-Capillary: 191 mg/dL — ABNORMAL HIGH (ref 70–99)
Glucose-Capillary: 191 mg/dL — ABNORMAL HIGH (ref 70–99)

## 2023-05-06 LAB — MAGNESIUM: Magnesium: 2 mg/dL (ref 1.7–2.4)

## 2023-05-06 LAB — TSH: TSH: 0.119 u[IU]/mL — ABNORMAL LOW (ref 0.350–4.500)

## 2023-05-06 MED ORDER — METHOCARBAMOL 500 MG PO TABS
500.0000 mg | ORAL_TABLET | Freq: Three times a day (TID) | ORAL | Status: DC | PRN
  Administered 2023-05-07 (×2): 500 mg via ORAL
  Filled 2023-05-06 (×2): qty 1

## 2023-05-06 MED ORDER — DILTIAZEM HCL 30 MG PO TABS
30.0000 mg | ORAL_TABLET | Freq: Four times a day (QID) | ORAL | Status: DC
  Administered 2023-05-06 – 2023-05-07 (×4): 30 mg via ORAL
  Filled 2023-05-06 (×4): qty 1

## 2023-05-06 MED ORDER — APIXABAN 2.5 MG PO TABS: 2.5000 mg | ORAL_TABLET | Freq: Two times a day (BID) | ORAL | Status: DC

## 2023-05-06 MED ORDER — EMPAGLIFLOZIN 25 MG PO TABS
25.0000 mg | ORAL_TABLET | Freq: Every day | ORAL | Status: DC
  Administered 2023-05-06 – 2023-05-08 (×3): 25 mg via ORAL
  Filled 2023-05-06 (×6): qty 1

## 2023-05-06 MED ORDER — AMIODARONE HCL 200 MG PO TABS
200.0000 mg | ORAL_TABLET | Freq: Two times a day (BID) | ORAL | Status: DC
  Administered 2023-05-06 – 2023-05-08 (×5): 200 mg via ORAL
  Filled 2023-05-06 (×5): qty 1

## 2023-05-06 NOTE — Telephone Encounter (Signed)
Pharmacy Patient Advocate Encounter  Insurance verification completed.    The patient is insured through [employer] Part D   The patient is currently admitted and ran test claims for the following: Eliquis.  Copays and coinsurance results were relayed to Inpatient clinical team. 

## 2023-05-06 NOTE — Plan of Care (Signed)
  Problem: Acute Rehab PT Goals(only PT should resolve) Goal: Pt Will Go Supine/Side To Sit Outcome: Progressing Flowsheets (Taken 05/06/2023 1514) Pt will go Supine/Side to Sit:  with minimal assist  with moderate assist Goal: Patient Will Transfer Sit To/From Stand Outcome: Progressing Flowsheets (Taken 05/06/2023 1514) Patient will transfer sit to/from stand:  with minimal assist  with moderate assist Goal: Pt Will Transfer Bed To Chair/Chair To Bed Outcome: Progressing Flowsheets (Taken 05/06/2023 1514) Pt will Transfer Bed to Chair/Chair to Bed:  with min assist  with mod assist Goal: Pt Will Ambulate Outcome: Progressing Flowsheets (Taken 05/06/2023 1514) Pt will Ambulate:  25 feet  with minimal assist  with moderate assist  with rolling walker   3:14 PM, 05/06/23 Ocie Bob, MPT Physical Therapist with [facility] 336 [phone] office [phone] mobile phone

## 2023-05-06 NOTE — Discharge Instructions (Addendum)
ORTHOPEDIC PLAN AND INSTRUCTIONS PER DR CAIRNS: Weightbearing: WBAT RLE; Posterior hip precautions.  Insicional and dressing care: Reinforce dressings as needed Orthopedic device(s):  Hip abduction pillow in place while in bed VTE prophylaxis: At discretion of the primary team.  No orthopedic contraindications. Pain control: As needed.  Judicious use of narcotics.  Follow - up plan: Staples to be removed in 2 weeks.  Can follow up in 4-6 weeks with Dr. Dallas Schimke if no issues.        Information on my medicine - ELIQUIS (apixaban)  Why was Eliquis prescribed for you? Eliquis was prescribed for you to reduce the risk of a blood clot forming that can cause a stroke if you have a medical condition called atrial fibrillation (a type of irregular heartbeat).  What do You need to know about Eliquis ? Take your Eliquis TWICE DAILY - one tablet in the morning and one tablet in the evening with or without food. If you have difficulty swallowing the tablet whole please discuss with your pharmacist how to take the medication safely.  Take Eliquis exactly as prescribed by your doctor and DO NOT stop taking Eliquis without talking to the doctor who prescribed the medication.  Stopping may increase your risk of developing a stroke.  Refill your prescription before you run out.  After discharge, you should have regular check-up appointments with your healthcare provider that is prescribing your Eliquis.  In the future your dose may need to be changed if your kidney function or weight changes by a significant amount or as you get older.  What do you do if you miss a dose? If you miss a dose, take it as soon as you remember on the same day and resume taking twice daily.  Do not take more than one dose of ELIQUIS at the same time to make up a missed dose.  Important Safety Information A possible side effect of Eliquis is bleeding. You should call your healthcare provider right away if you experience  any of the following: Bleeding from an injury or your nose that does not stop. Unusual colored urine (red or dark brown) or unusual colored stools (red or black). Unusual bruising for unknown reasons. A serious fall or if you hit your head (even if there is no bleeding).  Some medicines may interact with Eliquis and might increase your risk of bleeding or clotting while on Eliquis. To help avoid this, consult your healthcare provider or pharmacist prior to using any new prescription or non-prescription medications, including herbals, vitamins, non-steroidal anti-inflammatory drugs (NSAIDs) and supplements.  This website has more information on Eliquis (apixaban): http://www.eliquis.com/eliquis/home

## 2023-05-06 NOTE — Plan of Care (Signed)

## 2023-05-06 NOTE — TOC Benefit Eligibility Note (Signed)
Patient Product/process development scientist completed.    The patient is currently admitted and upon discharge could be taking Eliquis 5 mg.  The current 30 day co-pay is $548.38 due to a deductible.   The patient is insured through [employer] part D   This test claim was processed through [facility]- copay amounts may vary at other pharmacies due to pharmacy/plan contracts, or as the patient moves through the different stages of their insurance plan.  Roland Earl, CPHT Pharmacy Patient Advocate Specialist [facility] Patient Advocate Team Direct Number: [phone]  Fax: [phone]

## 2023-05-06 NOTE — Progress Notes (Signed)
PROGRESS NOTE  Traci Koch ZOX:[id number] DOB: [birth year] DOA: 05/03/2023 PCP: Rebekah Chesterfield, NP  Brief History:   82 year old female with history of hypertension, diabetes mellitus type 2, hypothyroidism presenting with mechanical fall.  The patient was getting ready for church when she got her pant leg caught on a dresser drawer.  This caused her to fall onto her right side.  She was having difficulty getting up secondary to pain.  She denies loss of consciousness or hitting her head.  The patient had been in her usual state of health without any complaints. Patient denies fevers, chills, headache, chest pain, dyspnea, nausea, vomiting, diarrhea, abdominal pain, dysuria, hematuria, hematochezia, and melena. In the ED, the patient was afebrile and hemodynamically stable with oxygen saturation 98% room air.  WBC 10.8, hemoglobin 14.3, platelets 227,000.  Sodium 139, potassium 3.6, bicarbonate 24, serum creatinine 0.82.  LFTs were unremarkable.  EKG showed sinus rhythm with no ST-T wave changes.  Chest x-ray showed slight increase in interstitial markings.  Orthopedics, Dr. Dallas Schimke was consulted and patient underwent Right hip hemiarthroplasty.  On 05/05/23 am, patient developed new onset atrial fibrillation with RVR.  Pt states she has no hx of dysrhythmias.  Cardiology was consulted to assist.  Assessment/Plan:  Right Intertrochanteric hip fracture -Ortho--Dr. Dallas Schimke consulted -05/04/23--POD#2 s/p ORIF right hip hemiarthroplasty -at baseline pt is completely independent with ADLS -able to function >4 METs -PT eval pending  New onset Atrial Fibrillation -new onset am 05/05/23 -no hx of dysrhythmias in past -started diltiazem infusion--transitioned to oral diltiazem 5/8 -holding ARB to allow BP margin for diltiazem titration -echocardiogram  -per ortho ok to anticoagulate, plan to start apixaban 5/9  -cardiology consulted, added amiodarone 5/8  -personally reviewed EKG--afib with  RVR, nonspecific ST change   Essential hypertension -hold ARB to allow BP margin for diltiazem titration -Hydralazine prn SBP >170   Diabetes mellitus type 2, controlled -Held Jardiance and metformin -restart jardiance 5/8 -NovoLog sliding scale  CBG (last 3)  Recent Labs    05/06/23 0626 05/06/23 0853 05/06/23 1238  GLUCAP 160* 167* 206*    Hyperthyroidism -TSH very suppressed -follow up free T4 -holding levothyroxine for now      Family Communication:  no Family at bedside   Consultants:  ortho--[place]; cardiology   Code Status:  FULL   DVT Prophylaxis:  apixaban to start 5/9     Procedures: As Listed in Progress Note Above   Antibiotics: None    Subjective: Pt reports that she has no CP or SOB.  No palpitations.    Objective: Vitals:   05/06/23 0600 05/06/23 0611 05/06/23 0700 05/06/23 0900  BP: (!) 137/57   (!) 156/43  Pulse: 79   100  Resp: 19   14  Temp:   98.5 F (36.9 C)   TempSrc:   Oral   SpO2: 96%   96%  Weight:  82.8 kg    Height:        Intake/Output Summary (Last 24 hours) at 05/06/2023 1239 Last data filed at 05/06/2023 0800 Gross per 24 hour  Intake 1628.95 ml  Output 1400 ml  Net 228.95 ml   Weight change: 3.421 kg Exam:  General:  Pt is alert, follows commands appropriately, not in acute distress HEENT: No icterus, No thrush, No neck mass, [place]/AT Cardiovascular: normal S1/S2, no rubs, no gallops Respiratory: fine bibasilar crackles. No wheeze Abdomen: Soft/+BS, non tender, non distended, no guarding Extremities: No edema,  No lymphangitis, No petechiae, No rashes, no synovitis   Data Reviewed: I have personally reviewed following labs and imaging studies Basic Metabolic Panel: Recent Labs  Lab 05/03/23 1406 05/04/23 0500 05/05/23 0501 05/06/23 0419  NA 139 138 137 136  K 3.6 3.7 3.9 3.9  CL 104 105 104 107  CO2 24 25 21* 17*  GLUCOSE 201* 150* 129* 139*  BUN 17 16 19 22   CREATININE 0.82 0.75 0.87 0.81  CALCIUM  9.0 8.6* 8.3* 8.0*  MG  --   --   --  2.0   Liver Function Tests: Recent Labs  Lab 05/03/23 1406 05/04/23 0500  AST 22 16  ALT 18 14  ALKPHOS 71 60  BILITOT 0.7 1.1  PROT 6.6 5.9*  ALBUMIN 3.8 3.2*   No results for input(s): "LIPASE", "AMYLASE" in the last 168 hours. No results for input(s): "AMMONIA" in the last 168 hours. Coagulation Profile: No results for input(s): "INR", "PROTIME" in the last 168 hours. CBC: Recent Labs  Lab 05/03/23 1406 05/04/23 0500 05/05/23 0501 05/06/23 0629  WBC 10.8* 6.8 9.5 8.6  NEUTROABS 9.6*  --   --   --   HGB 14.3 13.1 11.8* 10.0*  HCT 45.1 40.9 37.6 31.9*  MCV 89.7 90.3 93.3 93.3  PLT 227 189 197 169   Cardiac Enzymes: No results for input(s): "CKTOTAL", "CKMB", "CKMBINDEX", "TROPONINI" in the last 168 hours. BNP: Invalid input(s): "POCBNP" CBG: Recent Labs  Lab 05/05/23 1724 05/05/23 2327 05/06/23 0626 05/06/23 0853 05/06/23 1238  GLUCAP 190* 166* 160* 167* 206*   HbA1C: Recent Labs    05/05/23 0501  HGBA1C 7.2*   Urine analysis: No results found for: "COLORURINE", "APPEARANCEUR", "LABSPEC", "PHURINE", "GLUCOSEU", "HGBUR", "BILIRUBINUR", "KETONESUR", "PROTEINUR", "UROBILINOGEN", "NITRITE", "LEUKOCYTESUR" Sepsis Labs: @LABRCNTIP (procalcitonin:4,lacticidven:4) ) Recent Results (from the past 240 hour(s))  MRSA Next Gen by PCR, Nasal     Status: None   Collection Time: 05/03/23 10:40 PM   Specimen: Nasal Mucosa; Nasal Swab  Result Value Ref Range Status   MRSA by PCR Next Gen NOT DETECTED NOT DETECTED Final    Comment: (NOTE) The GeneXpert MRSA Assay (FDA approved for NASAL specimens only), is one component of a comprehensive MRSA colonization surveillance program. It is not intended to diagnose MRSA infection nor to guide or monitor treatment for MRSA infections. Test performance is not FDA approved in patients less than 24 years old. Performed at [facility], [address] [place], [place] [address]       Scheduled Meds:  amiodarone  200 mg Oral BID   Chlorhexidine Gluconate Cloth  6 each Topical Daily   diltiazem  30 mg Oral Q6H   insulin aspart  0-9 Units Subcutaneous TID WC   Continuous Infusions:  sodium chloride Stopped (05/06/23 0924)   methocarbamol (ROBAXIN) IV Stopped (05/03/23 2351)    Procedures/Studies: ECHOCARDIOGRAM COMPLETE  Result Date: 05/05/2023    ECHOCARDIOGRAM REPORT   Patient Name:   JOVITA IBARRA Date of Exam: 05/05/2023 Medical Rec #:  [id number]   Height:       63.0 in Accession #:    [id number]  Weight:       175.0 lb Date of Birth:  01-12-1941   BSA:          1.827 m Patient Age:    82 years    BP:           130/41 mmHg Patient Gender: F           HR:  103 bpm. [facility]am [facility] Procedure: 2D Echo, Cardiac Doppler and Color Doppler Indications:    Atrial Fibrillation I48.91  History:        Patient has no prior history of Echocardiogram examinations.                 Arrythmias:Atrial Fibrillation; Risk Factors:Hypertension and                 Diabetes.  Sonographer:    Aron Baba Referring Phys: [phone] DAVID TAT  Sonographer Comments: Image acquisition challenging due to patient body habitus. IMPRESSIONS  1. Left ventricular ejection fraction, by estimation, is 65 to 70%. The left ventricle has normal function. The left ventricle has no regional wall motion abnormalities. There is mild left ventricular hypertrophy. Left ventricular diastolic parameters are indeterminate.  2. Right ventricular systolic function is normal. The right ventricular size is normal. There is mildly elevated pulmonary artery systolic pressure.  3. The mitral valve is normal in structure. No evidence of mitral valve regurgitation. No evidence of mitral stenosis.  4. The aortic valve is tricuspid. Aortic valve regurgitation is not visualized. No aortic stenosis is present.  5. The inferior vena cava is normal in size with <50% respiratory variability, suggesting right atrial pressure of 8  mmHg. FINDINGS  Left Ventricle: Left ventricular ejection fraction, by estimation, is 65 to 70%. The left ventricle has normal function. The left ventricle has no regional wall motion abnormalities. The left ventricular internal cavity size was normal in size. There is  mild left ventricular hypertrophy. Left ventricular diastolic parameters are indeterminate. Right Ventricle: The right ventricular size is normal. Right vetricular wall thickness was not well visualized. Right ventricular systolic function is normal. There is mildly elevated pulmonary artery systolic pressure. The tricuspid regurgitant velocity  is 2.87 m/s, and with an assumed right atrial pressure of 8 mmHg, the estimated right ventricular systolic pressure is 40.9 mmHg. Left Atrium: Left atrial size was normal in size. Right Atrium: Right atrial size was normal in size. Pericardium: There is no evidence of pericardial effusion. Mitral Valve: The mitral valve is normal in structure. No evidence of mitral valve regurgitation. No evidence of mitral valve stenosis. Tricuspid Valve: The tricuspid valve is normal in structure. Tricuspid valve regurgitation is mild . No evidence of tricuspid stenosis. Aortic Valve: The aortic valve is tricuspid. Aortic valve regurgitation is not visualized. No aortic stenosis is present. Aortic valve mean gradient measures 6.9 mmHg. Aortic valve peak gradient measures 13.2 mmHg. Aortic valve area, by VTI measures 1.98  cm. Pulmonic Valve: The pulmonic valve was not well visualized. Pulmonic valve regurgitation is not visualized. No evidence of pulmonic stenosis. Aorta: The aortic root and ascending aorta are structurally normal, with no evidence of dilitation. Venous: The inferior vena cava is normal in size with less than 50% respiratory variability, suggesting right atrial pressure of 8 mmHg. IAS/Shunts: No atrial level shunt detected by color flow Doppler.  LEFT VENTRICLE PLAX 2D LVIDd:         4.30 cm   Diastology  LVIDs:         2.90 cm   LV e' medial:    7.80 cm/s LV PW:         1.10 cm   LV E/e' medial:  16.7 LV IVS:        1.10 cm   LV e' lateral:   9.45 cm/s LVOT diam:     1.60 cm   LV E/e' lateral: 13.8  LV SV:         58 LV SV Index:   32 LVOT Area:     2.01 cm  RIGHT VENTRICLE RV S prime:     19.60 cm/s TAPSE (M-mode): 2.0 cm LEFT ATRIUM             Index        RIGHT ATRIUM           Index LA diam:        4.20 cm 2.30 cm/m   RA Area:     11.00 cm LA Vol (A2C):   46.8 ml 25.62 ml/m  RA Volume:   25.10 ml  13.74 ml/m LA Vol (A4C):   55.5 ml 30.38 ml/m LA Biplane Vol: 51.8 ml 28.35 ml/m  AORTIC VALVE AV Area (Vmax):    1.91 cm AV Area (Vmean):   1.92 cm AV Area (VTI):     1.98 cm AV Vmax:           181.89 cm/s AV Vmean:          121.414 cm/s AV VTI:            0.292 m AV Peak Grad:      13.2 mmHg AV Mean Grad:      6.9 mmHg LVOT Vmax:         173.00 cm/s LVOT Vmean:        116.000 cm/s LVOT VTI:          0.288 m LVOT/AV VTI ratio: 0.99  AORTA Ao Root diam: 3.50 cm Ao Asc diam:  3.30 cm MITRAL VALVE                TRICUSPID VALVE MV Area (PHT): 2.56 cm     TR Peak grad:   32.9 mmHg MV Decel Time: 296 msec     TR Vmax:        287.00 cm/s MR Peak grad: 12.8 mmHg MR Vmax:      179.00 cm/s   SHUNTS MV E velocity: 130.00 cm/s  Systemic VTI:  0.29 m                             Systemic Diam: 1.60 cm Dina Rich MD Electronically signed by Dina Rich MD Signature Date/Time: 05/05/2023/3:20:13 PM    Final    DG HIP UNILAT WITH PELVIS 2-3 VIEWS RIGHT  Result Date: 05/04/2023 CLINICAL DATA:  Closed displaced fracture of the right femoral neck. Postop images. EXAM: DG HIP (WITH OR WITHOUT PELVIS) 2-3V RIGHT COMPARISON:  05/03/2023 FINDINGS: Patient has undergone a right hip arthroplasty. The right femoral stem is completely visualized. Right hip appears to be located and no evidence for a periprosthetic fracture. Lucency in the right hip soft tissues compatible with recent surgery and skin staples are present. No  gross abnormality to the left hip. Pelvic bony ring appears to be intact. IMPRESSION: Status post right hip arthroplasty.  No complicating features. Electronically Signed   By: Richarda Overlie M.D.   On: 05/04/2023 14:31   DG HIP UNILAT WITH PELVIS 2-3 VIEWS RIGHT  Result Date: 05/04/2023 CLINICAL DATA:  [id number] Hip fx, right, closed, initial encounter (HCC) [id number] EXAM: DG HIP (WITH OR WITHOUT PELVIS) 2-3V RIGHT COMPARISON:  05/03/2023 FINDINGS: Intraoperative images demonstrated a displaced right femoral neck fracture. IMPRESSION: Right femoral neck fracture on intraoperative images. Electronically Signed   By: Caprice Renshaw M.D.   On: 05/04/2023  14:14   DG C-Arm 1-60 Min-No Report  Result Date: 05/04/2023 Fluoroscopy was utilized by the requesting physician.  No radiographic interpretation.   DG Chest 1 View  Result Date: 05/03/2023 CLINICAL DATA:  Larey Seat, right hip fracture EXAM: CHEST  1 VIEW COMPARISON:  None Available. FINDINGS: Single frontal view of the chest demonstrates mild enlargement of the cardiac silhouette. There is central pulmonary vascular congestion, without airspace disease, effusion, or pneumothorax. No acute bony abnormalities. IMPRESSION: 1. Enlarged cardiac silhouette, with central pulmonary vascular congestion. No overt edema. Electronically Signed   By: Sharlet Salina M.D.   On: 05/03/2023 15:04   DG Hip Unilat W or Wo Pelvis 2-3 Views Right  Result Date: 05/03/2023 CLINICAL DATA:  Larey Seat, fracture EXAM: DG HIP (WITH OR WITHOUT PELVIS) 2-3V RIGHT COMPARISON:  None Available. FINDINGS: Frontal view of the pelvis as well as frontal and cross-table lateral views of the right hip are obtained. There is a comminuted intertrochanteric right hip fracture with marked impaction and varus angulation at the fracture site. No dislocation. Symmetrical bilateral hip osteoarthritis. Remainder of the bony pelvis is unremarkable. IMPRESSION: 1. Acute comminuted intertrochanteric right hip fracture, with  impaction and varus angulation at the fracture site. Electronically Signed   By: Sharlet Salina M.D.   On: 05/03/2023 15:03    Standley Dakins, MD  [facility]  If 7PM-7AM, please contact night-coverage [email or web address] Password [facility] 05/06/2023, 12:39 PM   LOS: 3 days

## 2023-05-06 NOTE — TOC Progression Note (Addendum)
Transition of Care [facility]) - Progression Note    Patient Details  Name: Traci Koch MRN: [id number] Date of Birth: 1941-09-11   PT working with patient again, first time in 2 days, since her episode and moved to ICU. Still recommending SNF. CM spoke with daughter, Misty Stanley. The plan is to still go home with home health.  Sarah updated.  Misty Stanley would like for team to education her mother on taking pain medication, especially before PT. Patient does not like to take medication. Team updated. PT will continue to work with her. DC planning for 2 days.   Misty Stanley states she will take off work once her mother is home and patient granddaughter is in [employer] school and will be staying with her.     Expected Discharge Plan: Home w Home Health Services Barriers to Discharge: Continued Medical Work up  Expected Discharge Plan and Services      Living arrangements for the past 2 months: Single Family Home                     HH Agency: [facility] Date [facility] Agency Contacted: 05/04/23 Time HH Agency Contacted: [id number] Representative spoke with at [facility] Agency: Maralyn Sago Cindie Laroche)   Social Determinants of Health (SDOH) Interventions SDOH Screenings   Food Insecurity: No Food Insecurity (05/04/2023)  Housing: Low Risk  (05/04/2023)  Transportation Needs: No Transportation Needs (05/04/2023)  Utilities: Not At Risk (05/04/2023)  Tobacco Use: Low Risk  (05/04/2023)    Readmission Risk Interventions    05/04/2023    1:51 PM  Readmission Risk Prevention Plan  Medication Screening Complete  Transportation Screening Complete

## 2023-05-06 NOTE — Evaluation (Signed)
Physical Therapy Evaluation Patient Details Name: Traci Koch MRN: [id number] DOB: 09/28/1941 Today's Date: 05/06/2023  History of Present Illness  Traci Koch  is a 82 y.o. female s/p Right hip hemiarthroplasty on 05/04/23, with medical history of diabetes mellitus type 2, hypertension, hypothyroidism who came to hospital after patient had fall at home.  Patient said that she was getting ready to go to church when her pants got caught in the drawer causing her to fall.  She fell on the right side, called her son who brought her to the hospital.  Patient admits to having pain in the right hip area.  X-ray of the urgent care was done and patient was advised to go to the ER for further evaluation.  In the ED x-ray of the hip shows right hip fracture.  Orthopedic surgery has been consulted by ED provider.  Plan for ORIF in a.m.   Clinical Impression  Patient demonstrates slow labored movement for sitting up at bedside with most difficulty moving RLE due to c/o severe pain, once seated required Mod/max assist for scooting to EOB, very unsteady on feet with poor tolerance for weightbearing on RLE and limited to a few slow labored side steps before having to sit due to fall risk.  Patient tolerated sitting up in chair after therapy - nursing staff notified.  Patient will benefit from continued skilled physical therapy in hospital and recommended venue below to increase strength, balance, endurance for safe ADLs and gait.          Recommendations for follow up therapy are one component of a multi-disciplinary discharge planning process, led by the attending physician.  Recommendations may be updated based on patient status, additional functional criteria and insurance authorization.  Follow Up Recommendations Can patient physically be transported by private vehicle: No     Assistance Recommended at Discharge Set up Supervision/Assistance  Patient can return home with the following  A lot of help with  bathing/dressing/bathroom;A lot of help with walking and/or transfers;Help with stairs or ramp for entrance;Assistance with cooking/housework    Equipment Recommendations Rolling walker (2 wheels)  Recommendations for Other Services       Functional Status Assessment Patient has had a recent decline in their functional status and demonstrates the ability to make significant improvements in function in a reasonable and predictable amount of time.     Precautions / Restrictions Precautions Precautions: Fall;Posterior Hip Precaution Booklet Issued: No Required Braces or Orthoses: Other Brace Other Brace: abduction pillow when in bed Restrictions Weight Bearing Restrictions: Yes RLE Weight Bearing: Weight bearing as tolerated      Mobility  Bed Mobility Overal bed mobility: Needs Assistance Bed Mobility: Supine to Sit     Supine to sit: Mod assist, Max assist     General bed mobility comments: increased time, labored movement poor carryover for moving RLE due to increased pain    Transfers Overall transfer level: Needs assistance Equipment used: Rolling walker (2 wheels) Transfers: Sit to/from Stand, Bed to chair/wheelchair/BSC Sit to Stand: Mod assist, Max assist   Step pivot transfers: Mod assist, Max assist       General transfer comment: unsteady labored movement with poor tolerance for weightbearing on RLE    Ambulation/Gait Ambulation/Gait assistance: Mod assist, Max assist Gait Distance (Feet): 4 Feet Assistive device: Rolling walker (2 wheels) Gait Pattern/deviations: Decreased step length - right, Decreased step length - left, Decreased stride length, Decreased stance time - right, Antalgic, Knees buckling, Shuffle Gait velocity: slow  General Gait Details: limited to a few slow labored side steps with mostly shuffling on RLE due to poor tolerance for weightbearing  Stairs            Wheelchair Mobility    Modified Rankin (Stroke Patients  Only)       Balance Overall balance assessment: Needs assistance Sitting-balance support: No upper extremity supported, Feet supported Sitting balance-Leahy Scale: Fair Sitting balance - Comments: seated at EOB   Standing balance support: Reliant on assistive device for balance, During functional activity, Bilateral upper extremity supported Standing balance-Leahy Scale: Poor Standing balance comment: using RW                             Pertinent Vitals/Pain Pain Assessment Pain Assessment: Faces Faces Pain Scale: Hurts whole lot Pain Location: right hip with movement Pain Descriptors / Indicators: Sore, Grimacing, Guarding, Discomfort, Sharp, Moaning Pain Intervention(s): Limited activity within patient's tolerance, Monitored during session, RN gave pain meds during session, Repositioned    Home Living Family/patient expects to be discharged to:: Private residence Living Arrangements: Alone Available Help at Discharge: Family;Available PRN/intermittently Type of Home: House Home Access: Stairs to enter Entrance Stairs-Rails: Left Entrance Stairs-Number of Steps: 2   Home Layout: One level Home Equipment: Cane - single point;Shower seat      Prior Function Prior Level of Function : Independent/Modified Independent;Driving             Mobility Comments: Tourist information centre manager without AD, drives ADLs Comments: Independent     Hand Dominance   Dominant Hand: Right    Extremity/Trunk Assessment   Upper Extremity Assessment Upper Extremity Assessment: Generalized weakness    Lower Extremity Assessment Lower Extremity Assessment: Generalized weakness;RLE deficits/detail RLE Deficits / Details: grossly -3/5 RLE: Unable to fully assess due to pain RLE Sensation: WNL RLE Coordination: WNL    Cervical / Trunk Assessment Cervical / Trunk Assessment: Normal  Communication   Communication: No difficulties  Cognition Arousal/Alertness:  Awake/alert Behavior During Therapy: WFL for tasks assessed/performed Overall Cognitive Status: Within Functional Limits for tasks assessed                                          General Comments      Exercises     Assessment/Plan    PT Assessment Patient needs continued PT services  PT Problem List Decreased strength;Decreased activity tolerance;Decreased balance;Decreased mobility;Pain       PT Treatment Interventions DME instruction;Gait training;Stair training;Functional mobility training;Therapeutic activities;Therapeutic exercise;Patient/family education;Balance training    PT Goals (Current goals can be found in the Care Plan section)  Acute Rehab PT Goals Patient Stated Goal: return home after rehab PT Goal Formulation: With patient Time For Goal Achievement: 05/20/23 Potential to Achieve Goals: Good    Frequency Min 3X/week     Co-evaluation               AM-PAC PT "6 Clicks" Mobility  Outcome Measure Help needed turning from your back to your side while in a flat bed without using bedrails?: A Lot Help needed moving from lying on your back to sitting on the side of a flat bed without using bedrails?: A Lot Help needed moving to and from a bed to a chair (including a wheelchair)?: A Lot Help needed standing up from a chair using your arms (e.g.,  wheelchair or bedside chair)?: A Lot Help needed to walk in hospital room?: A Lot Help needed climbing 3-5 steps with a railing? : Total 6 Click Score: 11    End of Session   Activity Tolerance: Patient tolerated treatment well;Patient limited by fatigue Patient left: in chair;with call bell/phone within reach;with family/visitor present Nurse Communication: Mobility status PT Visit Diagnosis: Unsteadiness on feet (R26.81);Other abnormalities of gait and mobility (R26.89);Muscle weakness (generalized) (M62.81)    Time: 1610-9604 PT Time Calculation (min) (ACUTE ONLY): 28 min   Charges:    PT Evaluation $PT Eval Moderate Complexity: 1 Mod PT Treatments $Therapeutic Activity: 23-37 mins        3:13 PM, 05/06/23 Ocie Bob, MPT Physical Therapist with [facility] [phone] office [phone] mobile phone

## 2023-05-06 NOTE — Progress Notes (Signed)
OT Cancellation Note  Patient Details Name: Traci Koch MRN: [id number] DOB: 10/16/1941   Cancelled Treatment:    Reason Eval/Treat Not Completed: Medical issues which prohibited therapy. Pt was moved to a higher level of care and will be removed from the OT evaluation list. Please add a new order when pt is medically appropriate. Thank you.   Jakeira Seeman OT, MOT   Danie Chandler 05/06/2023, 7:58 AM

## 2023-05-06 NOTE — Progress Notes (Signed)
Progress Note  Patient Name: Traci Koch Date of Encounter: 05/06/2023  Primary Cardiologist: Dina Rich, MD  Interval Summary   Reports less of a sense of palpitations this morning.  Cardiology consultation and interval hospital course reviewed.  She was in sinus rhythm temporarily yesterday, confirmed by ECG.  In atrial fibrillation today on diltiazem IV 5 mg/h.  Vital Signs    Vitals:   05/06/23 0400 05/06/23 0500 05/06/23 0600 05/06/23 0611  BP: (!) 134/48 (!) 141/43 (!) 137/57   Pulse: (!) 108 97 79   Resp: (!) 0 (!) 0 19   Temp:  98.6 F (37 C)    TempSrc:  Oral    SpO2: 95% 95% 96%   Weight:    82.8 kg  Height:        Intake/Output Summary (Last 24 hours) at 05/06/2023 0845 Last data filed at 05/06/2023 0600 Gross per 24 hour  Intake 1754.49 ml  Output 1400 ml  Net 354.49 ml   Filed Weights   05/03/23 1349 05/04/23 0809 05/06/23 0611  Weight: 79.4 kg 79.4 kg 82.8 kg    Physical Exam   GEN: No acute distress.   Neck: No JVD. Cardiac: Irregularly irregular, no significant murmur or gallop.  Respiratory: Nonlabored. Clear to auscultation bilaterally. GI: Soft, nontender, bowel sounds present. MS: No edema.  ECG/Telemetry    An ECG dated 05/05/2023 was personally reviewed today and demonstrated:  Sinus rhythm with prolonged PR interval, increased voltage, left anterior fascicular block, poor R wave progression.  Labs    Chemistry Recent Labs  Lab 05/03/23 1406 05/04/23 0500 05/05/23 0501 05/06/23 0419  NA 139 138 137 136  K 3.6 3.7 3.9 3.9  CL 104 105 104 107  CO2 24 25 21* 17*  GLUCOSE 201* 150* 129* 139*  BUN 17 16 19 22   CREATININE 0.82 0.75 0.87 0.81  CALCIUM 9.0 8.6* 8.3* 8.0*  PROT 6.6 5.9*  --   --   ALBUMIN 3.8 3.2*  --   --   AST 22 16  --   --   ALT 18 14  --   --   ALKPHOS 71 60  --   --   BILITOT 0.7 1.1  --   --   GFRNONAA >60 >60 >60 >60  ANIONGAP 11 8 12 12     Hematology Recent Labs  Lab 05/04/23 0500 05/05/23 0501  05/06/23 0629  WBC 6.8 9.5 8.6  RBC 4.53 4.03 3.42*  HGB 13.1 11.8* 10.0*  HCT 40.9 37.6 31.9*  MCV 90.3 93.3 93.3  MCH 28.9 29.3 29.2  MCHC 32.0 31.4 31.3  RDW 14.9 15.3 15.4  PLT 189 197 169    Cardiac Studies   Echocardiogram 05/05/2023:  1. Left ventricular ejection fraction, by estimation, is 65 to 70%. The  left ventricle has normal function. The left ventricle has no regional  wall motion abnormalities. There is mild left ventricular hypertrophy.  Left ventricular diastolic parameters  are indeterminate.   2. Right ventricular systolic function is normal. The right ventricular  size is normal. There is mildly elevated pulmonary artery systolic  pressure.   3. The mitral valve is normal in structure. No evidence of mitral valve  regurgitation. No evidence of mitral stenosis.   4. The aortic valve is tricuspid. Aortic valve regurgitation is not  visualized. No aortic stenosis is present.   5. The inferior vena cava is normal in size with <50% respiratory  variability, suggesting right atrial pressure of 8  mmHg.   Assessment & Plan   1.  Postoperative atrial fibrillation with RVR, CHA2DS2-VASc score is 5.  She is on diltiazem IV 5 mg/h with better heart rate control and improved symptoms.  She was in sinus rhythm temporarily yesterday.  No prior history of atrial fibrillation documented, however she does admit to occasional sense of palpitations in the past.  LVEF 65 to 70% without regional wall motion abnormalities by echocardiogram yesterday.  Left atrium described as normal in size.  2.  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  3.  Essential hypertension.  On Benicar as an outpatient, currently held and with systolics ranging 130s to 140s.  4.  Type 2 diabetes mellitus, on Jardiance and Glucophage as an outpatient.  5.  Hypothyroidism on Synthroid.  Recent TSH 0.119.  Chart reviewed and situation discussed with patient and nursing.  Plan to transition to oral  Cardizem starting at 30 mg p.o. every 6 hours and advancing as needed for heart rate control.  Also will use limited oral amiodarone load starting at 200 mg twice daily to see if we can get her back in sinus rhythm and maintain in the postoperative period.  Once okay with surgical team in terms of wounds, would go ahead and start Eliquis 5 mg twice daily for stroke prophylaxis.  Hold Synthroid for now given low TSH.  Would also hold off on resuming Benicar until blood pressure trend is better defined on AV nodal blockers.  For questions or updates, please contact [facility] Please consult [email or web address] for contact info under   Signed, Nona Dell, MD  05/06/2023, 8:45 AM

## 2023-05-06 NOTE — Progress Notes (Signed)
   ORTHOPAEDIC PROGRESS NOTE  s/p Procedure(s): Right hip hemiarthroplasty  DOS: 05/04/23  SUBJECTIVE: Found to be in new onset A-fib yesterday morning.  She was transferred to the stepdown unit and remained stable.  No issues overnight.  She continues to have some soreness in the right hip.  No numbness or tingling.  No fevers or chills.  OBJECTIVE: PE:  Alert and oriented, no acute distress Hip abduction pillow in place Dressing is clean, dry and intact Toes are [facility] Sensation intact to dorsum of foot Active motion intact to ankle and great toe 2+ DP Pulse  Vitals:   05/06/23 0500 05/06/23 0600  BP: (!) 141/43 (!) 137/57  Pulse: 97 79  Resp: (!) 0 19  Temp: 98.6 F (37 C)   SpO2: 95% 96%      Latest Ref Rng & Units 05/06/2023    6:29 AM 05/05/2023    5:01 AM 05/04/2023    5:00 AM  CBC  WBC 4.0 - 10.5 K/uL 8.6  9.5  6.8   Hemoglobin 12.0 - 15.0 g/dL 16.1  09.6  04.5   Hematocrit 36.0 - 46.0 % 31.9  37.6  40.9   Platelets 150 - 400 K/uL 169  197  189      ASSESSMENT: Traci Koch is a 82 y.o. female transferred to stepdown unit postop day #1 for new onset A-fib.  She is on a heparin drip, with possible transition to an oral anticoagulant.  Pain in the right hip is appropriate.  Awaiting PT/OT evaluation.  PLAN: Weightbearing: WBAT RLE; Posterior hip precautions.  Insicional and dressing care: Reinforce dressings as needed Orthopedic device(s):  Hip abduction pillow in place while in bed VTE prophylaxis: At discretion of the primary team.  No orthopedic contraindications. Pain control: As needed.  Judicious use of narcotics.  Follow - up plan: 2 weeks.  Staples to be removed around 2 weeks.  Can follow up in 6 weeks if no issues.   PT/OT Foley out   Contact information:     Shakaria Raphael A. Dallas Schimke, MD MS [facility] [address] [place],  [place]  [address] Phone: [phone] Fax: [phone]

## 2023-05-07 ENCOUNTER — Encounter (HOSPITAL_COMMUNITY): Payer: Self-pay | Admitting: Orthopedic Surgery

## 2023-05-07 DIAGNOSIS — I10 Essential (primary) hypertension: Secondary | ICD-10-CM | POA: Diagnosis not present

## 2023-05-07 DIAGNOSIS — I9789 Other postprocedural complications and disorders of the circulatory system, not elsewhere classified: Secondary | ICD-10-CM

## 2023-05-07 DIAGNOSIS — I4891 Unspecified atrial fibrillation: Secondary | ICD-10-CM | POA: Diagnosis not present

## 2023-05-07 DIAGNOSIS — S72001D Fracture of unspecified part of neck of right femur, subsequent encounter for closed fracture with routine healing: Secondary | ICD-10-CM | POA: Diagnosis not present

## 2023-05-07 LAB — MAGNESIUM: Magnesium: 2.1 mg/dL (ref 1.7–2.4)

## 2023-05-07 LAB — CBC
HCT: 32 % — ABNORMAL LOW (ref 36.0–46.0)
Hemoglobin: 10.1 g/dL — ABNORMAL LOW (ref 12.0–15.0)
MCH: 29.3 pg (ref 26.0–34.0)
MCHC: 31.6 g/dL (ref 30.0–36.0)
MCV: 92.8 fL (ref 80.0–100.0)
Platelets: 159 10*3/uL (ref 150–400)
RBC: 3.45 MIL/uL — ABNORMAL LOW (ref 3.87–5.11)
RDW: 15.3 % (ref 11.5–15.5)
WBC: 8.4 10*3/uL (ref 4.0–10.5)
nRBC: 0 % (ref 0.0–0.2)

## 2023-05-07 LAB — BASIC METABOLIC PANEL
Anion gap: 7 (ref 5–15)
BUN: 26 mg/dL — ABNORMAL HIGH (ref 8–23)
CO2: 20 mmol/L — ABNORMAL LOW (ref 22–32)
Calcium: 8.3 mg/dL — ABNORMAL LOW (ref 8.9–10.3)
Chloride: 109 mmol/L (ref 98–111)
Creatinine, Ser: 0.84 mg/dL (ref 0.44–1.00)
GFR, Estimated: >60 mL/min (ref >60)
Glucose, Bld: 140 mg/dL — ABNORMAL HIGH (ref 70–99)
Potassium: 3.7 mmol/L (ref 3.5–5.1)
Sodium: 136 mmol/L (ref 135–145)

## 2023-05-07 LAB — GLUCOSE, CAPILLARY
Glucose-Capillary: 154 mg/dL — ABNORMAL HIGH (ref 70–99)
Glucose-Capillary: 183 mg/dL — ABNORMAL HIGH (ref 70–99)
Glucose-Capillary: 162 mg/dL — ABNORMAL HIGH (ref 70–99)
Glucose-Capillary: 150 mg/dL — ABNORMAL HIGH (ref 70–99)

## 2023-05-07 LAB — T4, FREE: Free T4: 1.31 ng/dL — ABNORMAL HIGH (ref 0.61–1.12)

## 2023-05-07 MED ORDER — APIXABAN 5 MG PO TABS
5.0000 mg | ORAL_TABLET | Freq: Two times a day (BID) | ORAL | Status: DC
  Administered 2023-05-07 – 2023-05-08 (×3): 5 mg via ORAL
  Filled 2023-05-07 (×3): qty 1

## 2023-05-07 MED ORDER — DILTIAZEM HCL ER COATED BEADS 180 MG PO CP24
180.0000 mg | ORAL_CAPSULE | Freq: Every day | ORAL | Status: DC
  Administered 2023-05-07 – 2023-05-08 (×2): 180 mg via ORAL
  Filled 2023-05-07 (×2): qty 1

## 2023-05-07 NOTE — Progress Notes (Signed)
Transition of Care (TOC) -30 day Note       Patient Details  Name: Traci Koch  MRN: [id number] Date of Birth: 1941-08-18   Transition of Care [facility]) CM/SW Contact  Name: Elliot Gault Phone Number: [phone] Date: 05/07/2023 Time: 1236     To Whom it May Concern:   Please be advised that the above patient will require a short-term nursing home stay, anticipated 30 days or less rehabilitation and strengthening. The plan is for return home.

## 2023-05-07 NOTE — Progress Notes (Signed)
Pt arrived to room 327 via bed from ICU. Oriented to room and safety procedures, states understanding. Call bell in reach, bed in low position, bed alarm on. Family at bedside.

## 2023-05-07 NOTE — Progress Notes (Signed)
Progress Note  Patient Name: Traci Koch Date of Encounter: 05/07/2023  Primary Cardiologist: Dina Rich, MD  Interval Summary   Chart reviewed.  Patient evaluated by PT yesterday.  Plan is to start on Eliquis today.  She remains in atrial fibrillation, no sense of palpitations or chest pain reported this morning.  Written to transfer to telemetry.  Vital Signs    Vitals:   05/06/23 2000 05/07/23 0000 05/07/23 0400 05/07/23 0700  BP: 115/73 (!) 125/57 (!) 135/46   Pulse: (!) 113 (!) 104 94   Resp: 19 14 20    Temp: 98 F (36.7 C) 98.2 F (36.8 C) 98.1 F (36.7 C) 98.2 F (36.8 C)  TempSrc: Oral Oral Oral Oral  SpO2: 95% 100% 96%   Weight:      Height:        Intake/Output Summary (Last 24 hours) at 05/07/2023 0833 Last data filed at 05/07/2023 0040 Gross per 24 hour  Intake 506.62 ml  Output 1251 ml  Net -744.38 ml   Filed Weights   05/03/23 1349 05/04/23 0809 05/06/23 0611  Weight: 79.4 kg 79.4 kg 82.8 kg    Physical Exam   GEN: No acute distress.   Neck: No JVD. Cardiac: Irregularly irregular without significant murmur or gallop.  Respiratory: Nonlabored. Clear to auscultation bilaterally. GI: Soft, nontender, bowel sounds present. MS: No pitting edema.  ECG/Telemetry    Telemetry reviewed showing atrial fibrillation with heart rate 90-100 range this morning.  Labs    Chemistry Recent Labs  Lab 05/03/23 1406 05/04/23 0500 05/05/23 0501 05/06/23 0419 05/07/23 0451  NA 139 138 137 136 136  K 3.6 3.7 3.9 3.9 3.7  CL 104 105 104 107 109  CO2 24 25 21* 17* 20*  GLUCOSE 201* 150* 129* 139* 140*  BUN 17 16 19 22  26*  CREATININE 0.82 0.75 0.87 0.81 0.84  CALCIUM 9.0 8.6* 8.3* 8.0* 8.3*  PROT 6.6 5.9*  --   --   --   ALBUMIN 3.8 3.2*  --   --   --   AST 22 16  --   --   --   ALT 18 14  --   --   --   ALKPHOS 71 60  --   --   --   BILITOT 0.7 1.1  --   --   --   GFRNONAA >60 >60 >60 >60 >60  ANIONGAP 11 8 12 12 7     Hematology Recent Labs   Lab 05/05/23 0501 05/06/23 0629 05/07/23 0451  WBC 9.5 8.6 8.4  RBC 4.03 3.42* 3.45*  HGB 11.8* 10.0* 10.1*  HCT 37.6 31.9* 32.0*  MCV 93.3 93.3 92.8  MCH 29.3 29.2 29.3  MCHC 31.4 31.3 31.6  RDW 15.3 15.4 15.3  PLT 197 169 159    Cardiac Studies   Echocardiogram 05/05/2023:  1. Left ventricular ejection fraction, by estimation, is 65 to 70%. The  left ventricle has normal function. The left ventricle has no regional  wall motion abnormalities. There is mild left ventricular hypertrophy.  Left ventricular diastolic parameters  are indeterminate.   2. Right ventricular systolic function is normal. The right ventricular  size is normal. There is mildly elevated pulmonary artery systolic  pressure.   3. The mitral valve is normal in structure. No evidence of mitral valve  regurgitation. No evidence of mitral stenosis.   4. The aortic valve is tricuspid. Aortic valve regurgitation is not  visualized. No aortic stenosis is present.  5. The inferior vena cava is normal in size with <50% respiratory  variability, suggesting right atrial pressure of 8 mmHg.   Assessment & Plan   1.  Postoperative atrial fibrillation with RVR, CHA2DS2-VASc score is 5.  LVEF 65 to 70% without regional wall motion abnormalities by echocardiogram.  Left atrium described as normal in size.  She remains in atrial fibrillation although with better heart rate control, currently on oral amiodarone load and divided dose Cardizem.  Eliquis starting today.  2.  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  3.  Essential hypertension.  On Benicar as an outpatient, currently held and with systolics ranging 110s to 130s.  4.  Type 2 diabetes mellitus, on Jardiance and Glucophage as an outpatient.  5.  Hypothyroidism on Synthroid as an outpatient.  Recent TSH 0.119.  Synthroid held.  Continue amiodarone 200 mg p.o. twice daily, Eliquis to start today at 5 mg twice daily.  Change from short acting Cardizem  to Cardizem CD 180 mg daily with dose given this morning.  Increase activity per PT.  For questions or updates, please contact [facility] Please consult [email or web address] for contact info under   Signed, Nona Dell, MD  05/07/2023, 8:33 AM

## 2023-05-07 NOTE — NC FL2 (Signed)
[place] MEDICAID FL2 LEVEL OF CARE FORM     IDENTIFICATION  Patient Name: Traci Koch Birthdate: 17-Nov-1941 Sex: female Admission Date (Current Location): 05/03/2023  [facility] and [place] Number:  [employer] and Address:  [facility],  [address] [place] [address]      Provider Number: [id number]  Attending Physician Name and Address:  Cleora Fleet, MD  Relative Name and Phone Number:       Current Level of Care: Hospital Recommended Level of Care: Skilled Nursing Facility Prior Approval Number:    Date Approved/Denied:   PASRR Number:    Discharge Plan: SNF    Current Diagnoses: Patient Active Problem List   Diagnosis Date Noted   Atrial fibrillation with RVR (HCC) 05/05/2023   Essential hypertension 05/04/2023   Hip fracture (HCC) 05/03/2023    Orientation RESPIRATION BLADDER Height & Weight     Self, Time, Situation, Place  Normal Continent Weight: 182 lb 8.7 oz (82.8 kg) Height:  5\' 3"  (160 cm)  BEHAVIORAL SYMPTOMS/MOOD NEUROLOGICAL BOWEL NUTRITION STATUS      Continent Diet (see dc summary)  AMBULATORY STATUS COMMUNICATION OF NEEDS Skin   Extensive Assist Verbally Surgical wounds                       Personal Care Assistance Level of Assistance  Bathing, Feeding, Dressing Bathing Assistance: Limited assistance Feeding assistance: Independent Dressing Assistance: Limited assistance     Functional Limitations Info  Sight, Hearing, Speech Sight Info: Adequate Hearing Info: Adequate Speech Info: Adequate    SPECIAL CARE FACTORS FREQUENCY  PT (By licensed PT), OT (By licensed OT)     PT Frequency: 5x week OT Frequency: 3x week            Contractures Contractures Info: Not present    Additional Factors Info  Code Status, Allergies Code Status Info: Full Allergies Info: Codeine, Penicillins, Tetanus-dipth-acell Pertussis           Current Medications (05/07/2023):  This is the current hospital  active medication list Current Facility-Administered Medications  Medication Dose Route Frequency Provider Last Rate Last Admin   0.9 %  sodium chloride infusion   Intravenous Continuous Laural Benes, Clanford L, MD   Stopped at 05/06/23 4401   acetaminophen (TYLENOL) tablet 650 mg  650 mg Oral Q6H PRN Tat, Onalee Hua, MD   650 mg at 05/06/23 1527   Or   acetaminophen (TYLENOL) suppository 650 mg  650 mg Rectal Q6H PRN Tat, Onalee Hua, MD       amiodarone (PACERONE) tablet 200 mg  200 mg Oral BID Jonelle Sidle, MD   200 mg at 05/07/23 1024   apixaban (ELIQUIS) tablet 5 mg  5 mg Oral BID Jonelle Sidle, MD   5 mg at 05/07/23 1022   Chlorhexidine Gluconate Cloth 2 % PADS 6 each  6 each Topical Daily Tat, David, MD   6 each at 05/07/23 1000   diltiazem (CARDIZEM CD) 24 hr capsule 180 mg  180 mg Oral Daily Jonelle Sidle, MD   180 mg at 05/07/23 1024   empagliflozin (JARDIANCE) tablet 25 mg  25 mg Oral Daily Johnson, Clanford L, MD   25 mg at 05/07/23 1024   hydrALAZINE (APRESOLINE) injection 10 mg  10 mg Intravenous Q6H PRN Tat, Onalee Hua, MD       HYDROmorphone (DILAUDID) injection 0.5 mg  0.5 mg Intravenous Q3H PRN Catarina Hartshorn, MD   0.5 mg at  05/05/23 0825   insulin aspart (novoLOG) injection 0-9 Units  0-9 Units Subcutaneous TID WC Tat, Onalee Hua, MD   2 Units at 05/07/23 1222   methocarbamol (ROBAXIN) tablet 500 mg  500 mg Oral Q8H PRN Johnson, Clanford L, MD   500 mg at 05/07/23 1023   ondansetron (ZOFRAN) tablet 4 mg  4 mg Oral Q6H PRN Tat, David, MD       Or   ondansetron (ZOFRAN) injection 4 mg  4 mg Intravenous Q6H PRN Tat, David, MD       oxyCODONE (Oxy IR/ROXICODONE) immediate release tablet 5 mg  5 mg Oral Q6H PRN Catarina Hartshorn, MD   5 mg at 05/07/23 1025     Discharge Medications: Please see discharge summary for a list of discharge medications.  Relevant Imaging Results:  Relevant Lab Results:   Additional Information SSN: [id number] [place]

## 2023-05-07 NOTE — Progress Notes (Signed)
Physical Therapy Treatment Patient Details Name: Traci Koch MRN: [id number] DOB: 1941-11-28 Today's Date: 05/07/2023   History of Present Illness Traci Koch  is a 82 y.o. female s/p Right hip hemiarthroplasty on 05/04/23, with medical history of diabetes mellitus type 2, hypertension, hypothyroidism who came to hospital after patient had fall at home.  Patient said that she was getting ready to go to church when her pants got caught in the drawer causing her to fall.  She fell on the right side, called her son who brought her to the hospital.  Patient admits to having pain in the right hip area.  X-ray of the urgent care was done and patient was advised to go to the ER for further evaluation.  In the ED x-ray of the hip shows right hip fracture.  Orthopedic surgery has been consulted by ED provider.  Plan for ORIF in a.m.    PT Comments    Patient received pain medication prior to therapy and had less c/o pain during bed mobility, but has diffiuclty moving RLE due to weakness/pain in right hip, demonstrates fair/good return for completing BLE ROM/strengthening exercises requiring active assistance for completing most exercises with RLE, able to transfer to [facility] to urinate and later tolerated taking a few steps at bedside to transfer to chair.  Patient tolerated sitting up in chair after therapy - RN aware.  Patient will benefit from continued skilled physical therapy in hospital and recommended venue below to increase strength, balance, endurance for safe ADLs and gait.      Recommendations for follow up therapy are one component of a multi-disciplinary discharge planning process, led by the attending physician.  Recommendations may be updated based on patient status, additional functional criteria and insurance authorization.  Follow Up Recommendations  Can patient physically be transported by private vehicle: No    Assistance Recommended at Discharge Set up Supervision/Assistance  Patient can  return home with the following A lot of help with bathing/dressing/bathroom;A lot of help with walking and/or transfers;Help with stairs or ramp for entrance;Assistance with cooking/housework   Equipment Recommendations  Rolling walker (2 wheels)    Recommendations for Other Services       Precautions / Restrictions Precautions Precautions: Fall;Posterior Hip Precaution Booklet Issued: No Required Braces or Orthoses: Other Brace Other Brace: abduction pillow when in bed Restrictions Weight Bearing Restrictions: Yes RLE Weight Bearing: Weight bearing as tolerated     Mobility  Bed Mobility Overal bed mobility: Needs Assistance Bed Mobility: Supine to Sit     Supine to sit: Mod assist, Max assist     General bed mobility comments: slow labored movement with poor tolerance for moving RLE due to pain    Transfers Overall transfer level: Needs assistance Equipment used: Rolling walker (2 wheels) Transfers: Sit to/from Stand, Bed to chair/wheelchair/BSC Sit to Stand: Mod assist   Step pivot transfers: Mod assist, Max assist       General transfer comment: has difficulty extending trunk and supporting body weight with BUE due to weakness    Ambulation/Gait Ambulation/Gait assistance: Mod assist, Max assist Gait Distance (Feet): 5 Feet Assistive device: Rolling walker (2 wheels) Gait Pattern/deviations: Decreased step length - right, Decreased step length - left, Decreased stride length, Decreased stance time - right, Antalgic, Knees buckling, Shuffle Gait velocity: slow     General Gait Details: limited to a few slow labored side steps with mostly shuffling on RLE due to increased hip pain and poor tolerance for weightbearing   Stairs  Wheelchair Mobility    Modified Rankin (Stroke Patients Only)       Balance Overall balance assessment: Needs assistance Sitting-balance support: Feet supported, No upper extremity supported Sitting  balance-Leahy Scale: Fair Sitting balance - Comments: seated at EOB   Standing balance support: Reliant on assistive device for balance, During functional activity, Bilateral upper extremity supported Standing balance-Leahy Scale: Poor Standing balance comment: using RW                            Cognition Arousal/Alertness: Awake/alert Behavior During Therapy: WFL for tasks assessed/performed Overall Cognitive Status: Within Functional Limits for tasks assessed                                          Exercises General Exercises - Lower Extremity Long Arc Quad: Seated, AROM, AAROM, Strengthening, Both, 10 reps Hip Flexion/Marching: Seated, AROM, AAROM, Strengthening, Both, 10 reps Toe Raises: Seated, AROM, Strengthening, Both, 10 reps Heel Raises: Seated, AROM, Strengthening, Both, 10 reps    General Comments        Pertinent Vitals/Pain Pain Assessment Pain Assessment: Faces Faces Pain Scale: Hurts even more Pain Location: right hip with movement Pain Descriptors / Indicators: Sore, Grimacing, Sharp Pain Intervention(s): Limited activity within patient's tolerance, Monitored during session, Premedicated before session, Repositioned    Home Living                          Prior Function            PT Goals (current goals can now be found in the care plan section) Acute Rehab PT Goals Patient Stated Goal: return home after rehab PT Goal Formulation: With patient Time For Goal Achievement: 05/20/23 Potential to Achieve Goals: Good Progress towards PT goals: Progressing toward goals    Frequency    Min 3X/week      PT Plan Current plan remains appropriate    Co-evaluation              AM-PAC PT "6 Clicks" Mobility   Outcome Measure  Help needed turning from your back to your side while in a flat bed without using bedrails?: A Lot Help needed moving from lying on your back to sitting on the side of a flat bed  without using bedrails?: A Lot Help needed moving to and from a bed to a chair (including a wheelchair)?: A Lot Help needed standing up from a chair using your arms (e.g., wheelchair or bedside chair)?: A Lot Help needed to walk in hospital room?: A Lot Help needed climbing 3-5 steps with a railing? : Total 6 Click Score: 11    End of Session   Activity Tolerance: Patient tolerated treatment well;Patient limited by fatigue Patient left: in chair;with call bell/phone within reach Nurse Communication: Mobility status PT Visit Diagnosis: Unsteadiness on feet (R26.81);Other abnormalities of gait and mobility (R26.89);Muscle weakness (generalized) (M62.81)     Time: 1610-9604 PT Time Calculation (min) (ACUTE ONLY): 27 min  Charges:  $Therapeutic Exercise: 8-22 mins $Therapeutic Activity: 8-22 mins                     2:56 PM, 05/07/23 Ocie Bob, MPT Physical Therapist with [facility] [phone] office [phone] mobile phone

## 2023-05-07 NOTE — TOC Progression Note (Signed)
Transition of Care [facility]) - Progression Note    Patient Details  Name: Traci Koch MRN: [id number] Date of Birth: 03-12-1941  Transition [facility]) CM/SW Contact  Elliot Gault, LCSW Phone Number: 05/07/2023, 12:23 PM  Clinical Narrative:     TOC following. Updated by MD that pt now requesting SNF rehab at dc. Met with pt at bedside to review dc planning. Pt confirms with TOC that she would like to go to SNF at dc. CMS provider options in [place] and [place] reviewed. Will refer in [place] as requested by pt.  MD anticipating dc tomorrow. Will follow.  Expected Discharge Plan: Skilled Nursing Facility Barriers to Discharge: Continued Medical Work up  Expected Discharge Plan and Services In-house Referral: Clinical Social Work   Post Acute Care Choice: Skilled Nursing Facility Living arrangements for the past 2 months: Single Family Home                             HH Agency: [facility] Date [facility] Agency Contacted: 05/04/23 Time HH Agency Contacted: 1359 Representative spoke with at [facility] Agency: Maralyn Sago Cindie Laroche)   Social Determinants of Health (SDOH) Interventions SDOH Screenings   Food Insecurity: No Food Insecurity (05/04/2023)  Housing: Low Risk  (05/04/2023)  Transportation Needs: No Transportation Needs (05/04/2023)  Utilities: Not At Risk (05/04/2023)  Tobacco Use: Low Risk  (05/04/2023)    Readmission Risk Interventions    05/04/2023    1:51 PM  Readmission Risk Prevention Plan  Medication Screening Complete  Transportation Screening Complete

## 2023-05-07 NOTE — Progress Notes (Signed)
PROGRESS NOTE  Traci Koch ZOX:[id number] DOB: May 02, 1941 DOA: 05/03/2023 PCP: Rebekah Chesterfield, NP  Brief History:   82 year old female with history of hypertension, diabetes mellitus type 2, hypothyroidism presenting with mechanical fall.  The patient was getting ready for church when she got her pant leg caught on a dresser drawer.  This caused her to fall onto her right side.  She was having difficulty getting up secondary to pain.  She denies loss of consciousness or hitting her head.  The patient had been in her usual state of health without any complaints. Patient denies fevers, chills, headache, chest pain, dyspnea, nausea, vomiting, diarrhea, abdominal pain, dysuria, hematuria, hematochezia, and melena. In the ED, the patient was afebrile and hemodynamically stable with oxygen saturation 98% room air.  WBC 10.8, hemoglobin 14.3, platelets 227,000.  Sodium 139, potassium 3.6, bicarbonate 24, serum creatinine 0.82.  LFTs were unremarkable.  EKG showed sinus rhythm with no ST-T wave changes.  Chest x-ray showed slight increase in interstitial markings.  Orthopedics, Dr. Dallas Schimke was consulted and patient underwent Right hip hemiarthroplasty.  On 05/05/23 am, patient developed new onset atrial fibrillation with RVR.  Pt states she has no hx of dysrhythmias.  Cardiology was consulted to assist.  Assessment/Plan:  Right Intertrochanteric hip fracture -Ortho--Dr. Dallas Schimke consulted -05/04/23--POD#3 s/p ORIF right hip hemiarthroplasty -at baseline pt is completely independent with ADLS -able to function >4 METs -PT eval recommending SNF   New onset Atrial Fibrillation -new onset am 05/05/23 -no hx of dysrhythmias in past -started diltiazem infusion--transitioned to oral diltiazem 5/8 -holding ARB to allow BP margin for diltiazem titration -echocardiogram  -per ortho ok to anticoagulate, plan to start apixaban 5/9  -cardiology consulted, added amiodarone 5/8  -personally reviewed  EKG--afib with RVR, nonspecific ST change -apixaban started  -transitioned to oral cardizem CD 180 mg daily -oral amiodarone 200 mg BID    Essential hypertension -hold ARB to allow BP margin for diltiazem titration -Hydralazine prn SBP >170   Diabetes mellitus type 2, controlled -Held Jardiance and metformin -restart jardiance 5/8 -NovoLog sliding scale  CBG (last 3)  Recent Labs    05/06/23 2134 05/07/23 0743 05/07/23 1151  GLUCAP 191* 154* 183*    Hyperthyroidism -TSH very suppressed -follow up free T4 -holding levothyroxine for now -not planning to restart at discharge with very high free T4 noted      Family Communication:    Consultants:  ortho--[place]; cardiology   Code Status:  FULL   DVT Prophylaxis:  apixaban to start 5/9     Procedures: As Listed in Progress Note Above   Antibiotics: None    Subjective: Pt says that short term rehab is likely the better option for her, she will speak with Child psychotherapist. Otherwise she is feeling better and motivated to go to rehab.      Objective: Vitals:   05/07/23 1130 05/07/23 1200 05/07/23 1300 05/07/23 1518  BP:    122/72  Pulse: (!) 105 (!) 131 (!) 121 95  Resp: (!) 22 18 (!) 23 20  Temp: 98.9 F (37.2 C)   98 F (36.7 C)  TempSrc: Axillary   Oral  SpO2: 100% 94% 94% 96%  Weight:      Height:        Intake/Output Summary (Last 24 hours) at 05/07/2023 1551 Last data filed at 05/07/2023 1200 Gross per 24 hour  Intake 866.62 ml  Output 1350 ml  Net -483.38 ml  Weight change:  Exam:  General:  Pt is alert, follows commands appropriately, not in acute distress HEENT: No icterus, No thrush, No neck mass, [place]/AT Cardiovascular: normal S1/S2, no rubs, no gallops Respiratory: BBS clear to auscultation. No wheeze Abdomen: Soft/+BS, non tender, non distended, no guarding Extremities: wound clean and dry;  No petechiae, No rashes, no synovitis  Data Reviewed: I have personally reviewed following labs and  imaging studies Basic Metabolic Panel: Recent Labs  Lab 05/03/23 1406 05/04/23 0500 05/05/23 0501 05/06/23 0419 05/07/23 0451  NA 139 138 137 136 136  K 3.6 3.7 3.9 3.9 3.7  CL 104 105 104 107 109  CO2 24 25 21* 17* 20*  GLUCOSE 201* 150* 129* 139* 140*  BUN 17 16 19 22  26*  CREATININE 0.82 0.75 0.87 0.81 0.84  CALCIUM 9.0 8.6* 8.3* 8.0* 8.3*  MG  --   --   --  2.0 2.1   Liver Function Tests: Recent Labs  Lab 05/03/23 1406 05/04/23 0500  AST 22 16  ALT 18 14  ALKPHOS 71 60  BILITOT 0.7 1.1  PROT 6.6 5.9*  ALBUMIN 3.8 3.2*   No results for input(s): "LIPASE", "AMYLASE" in the last 168 hours. No results for input(s): "AMMONIA" in the last 168 hours. Coagulation Profile: No results for input(s): "INR", "PROTIME" in the last 168 hours. CBC: Recent Labs  Lab 05/03/23 1406 05/04/23 0500 05/05/23 0501 05/06/23 0629 05/07/23 0451  WBC 10.8* 6.8 9.5 8.6 8.4  NEUTROABS 9.6*  --   --   --   --   HGB 14.3 13.1 11.8* 10.0* 10.1*  HCT 45.1 40.9 37.6 31.9* 32.0*  MCV 89.7 90.3 93.3 93.3 92.8  PLT 227 189 197 169 159   Cardiac Enzymes: No results for input(s): "CKTOTAL", "CKMB", "CKMBINDEX", "TROPONINI" in the last 168 hours. BNP: Invalid input(s): "POCBNP" CBG: Recent Labs  Lab 05/06/23 1238 05/06/23 1618 05/06/23 2134 05/07/23 0743 05/07/23 1151  GLUCAP 206* 191* 191* 154* 183*   HbA1C: Recent Labs    05/05/23 0501  HGBA1C 7.2*   Urine analysis: No results found for: "COLORURINE", "APPEARANCEUR", "LABSPEC", "PHURINE", "GLUCOSEU", "HGBUR", "BILIRUBINUR", "KETONESUR", "PROTEINUR", "UROBILINOGEN", "NITRITE", "LEUKOCYTESUR" Sepsis Labs: @LABRCNTIP (procalcitonin:4,lacticidven:4) ) Recent Results (from the past 240 hour(s))  MRSA Next Gen by PCR, Nasal     Status: None   Collection Time: 05/03/23 10:40 PM   Specimen: Nasal Mucosa; Nasal Swab  Result Value Ref Range Status   MRSA by PCR Next Gen NOT DETECTED NOT DETECTED Final    Comment: (NOTE) The  GeneXpert MRSA Assay (FDA approved for NASAL specimens only), is one component of a comprehensive MRSA colonization surveillance program. It is not intended to diagnose MRSA infection nor to guide or monitor treatment for MRSA infections. Test performance is not FDA approved in patients less than 73 years old. Performed at [facility], [address] [place], [place] [address]      Scheduled Meds:  amiodarone  200 mg Oral BID   apixaban  5 mg Oral BID   Chlorhexidine Gluconate Cloth  6 each Topical Daily   diltiazem  180 mg Oral Daily   empagliflozin  25 mg Oral Daily   insulin aspart  0-9 Units Subcutaneous TID WC   Continuous Infusions:  sodium chloride Stopped (05/06/23 0924)    Procedures/Studies: ECHOCARDIOGRAM COMPLETE  Result Date: 05/05/2023    ECHOCARDIOGRAM REPORT   Patient Name:   Traci Koch Date of Exam: 05/05/2023 Medical Rec #:  [id number]   Height:  63.0 in Accession #:    [id number]  Weight:       175.0 lb Date of Birth:  Jul 30, 1941   BSA:          1.827 m Patient Age:    78 years    BP:           130/41 mmHg Patient Gender: F           HR:           103 bpm. [facility] Procedure: 2D Echo, Cardiac Doppler and Color Doppler Indications:    Atrial Fibrillation I48.91  History:        Patient has no prior history of Echocardiogram examinations.                 Arrythmias:Atrial Fibrillation; Risk Factors:Hypertension and                 Diabetes.  Sonographer:    Aron Baba Referring Phys: [phone] DAVID TAT  Sonographer Comments: Image acquisition challenging due to patient body habitus. IMPRESSIONS  1. Left ventricular ejection fraction, by estimation, is 65 to 70%. The left ventricle has normal function. The left ventricle has no regional wall motion abnormalities. There is mild left ventricular hypertrophy. Left ventricular diastolic parameters are indeterminate.  2. Right ventricular systolic function is normal. The right ventricular size is normal. There is  mildly elevated pulmonary artery systolic pressure.  3. The mitral valve is normal in structure. No evidence of mitral valve regurgitation. No evidence of mitral stenosis.  4. The aortic valve is tricuspid. Aortic valve regurgitation is not visualized. No aortic stenosis is present.  5. The inferior vena cava is normal in size with <50% respiratory variability, suggesting right atrial pressure of 8 mmHg. FINDINGS  Left Ventricle: Left ventricular ejection fraction, by estimation, is 65 to 70%. The left ventricle has normal function. The left ventricle has no regional wall motion abnormalities. The left ventricular internal cavity size was normal in size. There is  mild left ventricular hypertrophy. Left ventricular diastolic parameters are indeterminate. Right Ventricle: The right ventricular size is normal. Right vetricular wall thickness was not well visualized. Right ventricular systolic function is normal. There is mildly elevated pulmonary artery systolic pressure. The tricuspid regurgitant velocity  is 2.87 m/s, and with an assumed right atrial pressure of 8 mmHg, the estimated right ventricular systolic pressure is 40.9 mmHg. Left Atrium: Left atrial size was normal in size. Right Atrium: Right atrial size was normal in size. Pericardium: There is no evidence of pericardial effusion. Mitral Valve: The mitral valve is normal in structure. No evidence of mitral valve regurgitation. No evidence of mitral valve stenosis. Tricuspid Valve: The tricuspid valve is normal in structure. Tricuspid valve regurgitation is mild . No evidence of tricuspid stenosis. Aortic Valve: The aortic valve is tricuspid. Aortic valve regurgitation is not visualized. No aortic stenosis is present. Aortic valve mean gradient measures 6.9 mmHg. Aortic valve peak gradient measures 13.2 mmHg. Aortic valve area, by VTI measures 1.98  cm. Pulmonic Valve: The pulmonic valve was not well visualized. Pulmonic valve regurgitation is not  visualized. No evidence of pulmonic stenosis. Aorta: The aortic root and ascending aorta are structurally normal, with no evidence of dilitation. Venous: The inferior vena cava is normal in size with less than 50% respiratory variability, suggesting right atrial pressure of 8 mmHg. IAS/Shunts: No atrial level shunt detected by color flow Doppler.  LEFT VENTRICLE PLAX 2D LVIDd:  4.30 cm   Diastology LVIDs:         2.90 cm   LV e' medial:    7.80 cm/s LV PW:         1.10 cm   LV E/e' medial:  16.7 LV IVS:        1.10 cm   LV e' lateral:   9.45 cm/s LVOT diam:     1.60 cm   LV E/e' lateral: 13.8 LV SV:         58 LV SV Index:   32 LVOT Area:     2.01 cm  RIGHT VENTRICLE RV S prime:     19.60 cm/s TAPSE (M-mode): 2.0 cm LEFT ATRIUM             Index        RIGHT ATRIUM           Index LA diam:        4.20 cm 2.30 cm/m   RA Area:     11.00 cm LA Vol (A2C):   46.8 ml 25.62 ml/m  RA Volume:   25.10 ml  13.74 ml/m LA Vol (A4C):   55.5 ml 30.38 ml/m LA Biplane Vol: 51.8 ml 28.35 ml/m  AORTIC VALVE AV Area (Vmax):    1.91 cm AV Area (Vmean):   1.92 cm AV Area (VTI):     1.98 cm AV Vmax:           181.89 cm/s AV Vmean:          121.414 cm/s AV VTI:            0.292 m AV Peak Grad:      13.2 mmHg AV Mean Grad:      6.9 mmHg LVOT Vmax:         173.00 cm/s LVOT Vmean:        116.000 cm/s LVOT VTI:          0.288 m LVOT/AV VTI ratio: 0.99  AORTA Ao Root diam: 3.50 cm Ao Asc diam:  3.30 cm MITRAL VALVE                TRICUSPID VALVE MV Area (PHT): 2.56 cm     TR Peak grad:   32.9 mmHg MV Decel Time: 296 msec     TR Vmax:        287.00 cm/s MR Peak grad: 12.8 mmHg MR Vmax:      179.00 cm/s   SHUNTS MV E velocity: 130.00 cm/s  Systemic VTI:  0.29 m                             Systemic Diam: 1.60 cm Dina Rich MD Electronically signed by Dina Rich MD Signature Date/Time: 05/05/2023/3:20:13 PM    Final    DG HIP UNILAT WITH PELVIS 2-3 VIEWS RIGHT  Result Date: 05/04/2023 CLINICAL DATA:  Closed displaced  fracture of the right femoral neck. Postop images. EXAM: DG HIP (WITH OR WITHOUT PELVIS) 2-3V RIGHT COMPARISON:  05/03/2023 FINDINGS: Patient has undergone a right hip arthroplasty. The right femoral stem is completely visualized. Right hip appears to be located and no evidence for a periprosthetic fracture. Lucency in the right hip soft tissues compatible with recent surgery and skin staples are present. No gross abnormality to the left hip. Pelvic bony ring appears to be intact. IMPRESSION: Status post right hip arthroplasty.  No complicating features. Electronically Signed  By: Richarda Overlie M.D.   On: 05/04/2023 14:31   DG HIP UNILAT WITH PELVIS 2-3 VIEWS RIGHT  Result Date: 05/04/2023 CLINICAL DATA:  [id number] Hip fx, right, closed, initial encounter (HCC) [id number] EXAM: DG HIP (WITH OR WITHOUT PELVIS) 2-3V RIGHT COMPARISON:  05/03/2023 FINDINGS: Intraoperative images demonstrated a displaced right femoral neck fracture. IMPRESSION: Right femoral neck fracture on intraoperative images. Electronically Signed   By: Caprice Renshaw M.D.   On: 05/04/2023 14:14   DG C-Arm 1-60 Min-No Report  Result Date: 05/04/2023 Fluoroscopy was utilized by the requesting physician.  No radiographic interpretation.   DG Chest 1 View  Result Date: 05/03/2023 CLINICAL DATA:  Larey Seat, right hip fracture EXAM: CHEST  1 VIEW COMPARISON:  None Available. FINDINGS: Single frontal view of the chest demonstrates mild enlargement of the cardiac silhouette. There is central pulmonary vascular congestion, without airspace disease, effusion, or pneumothorax. No acute bony abnormalities. IMPRESSION: 1. Enlarged cardiac silhouette, with central pulmonary vascular congestion. No overt edema. Electronically Signed   By: Sharlet Salina M.D.   On: 05/03/2023 15:04   DG Hip Unilat W or Wo Pelvis 2-3 Views Right  Result Date: 05/03/2023 CLINICAL DATA:  Larey Seat, fracture EXAM: DG HIP (WITH OR WITHOUT PELVIS) 2-3V RIGHT COMPARISON:  None Available. FINDINGS:  Frontal view of the pelvis as well as frontal and cross-table lateral views of the right hip are obtained. There is a comminuted intertrochanteric right hip fracture with marked impaction and varus angulation at the fracture site. No dislocation. Symmetrical bilateral hip osteoarthritis. Remainder of the bony pelvis is unremarkable. IMPRESSION: 1. Acute comminuted intertrochanteric right hip fracture, with impaction and varus angulation at the fracture site. Electronically Signed   By: Sharlet Salina M.D.   On: 05/03/2023 15:03    Standley Dakins, MD  [facility]  If 7PM-7AM, please contact night-coverage [email or web address] Password [email or web address] 05/07/2023, 3:51 PM   LOS: 4 days

## 2023-05-08 DIAGNOSIS — I10 Essential (primary) hypertension: Secondary | ICD-10-CM | POA: Diagnosis not present

## 2023-05-08 DIAGNOSIS — S72001D Fracture of unspecified part of neck of right femur, subsequent encounter for closed fracture with routine healing: Secondary | ICD-10-CM | POA: Diagnosis not present

## 2023-05-08 DIAGNOSIS — I4819 Other persistent atrial fibrillation: Secondary | ICD-10-CM

## 2023-05-08 DIAGNOSIS — I4891 Unspecified atrial fibrillation: Secondary | ICD-10-CM | POA: Diagnosis not present

## 2023-05-08 LAB — GLUCOSE, CAPILLARY
Glucose-Capillary: 129 mg/dL — ABNORMAL HIGH (ref 70–99)
Glucose-Capillary: 174 mg/dL — ABNORMAL HIGH (ref 70–99)

## 2023-05-08 MED ORDER — ACETAMINOPHEN 325 MG PO TABS: 650.0000 mg | ORAL_TABLET | Freq: Four times a day (QID) | ORAL | Status: DC | PRN

## 2023-05-08 MED ORDER — METHOCARBAMOL 500 MG PO TABS: 500.0000 mg | ORAL_TABLET | Freq: Three times a day (TID) | ORAL | Status: DC | PRN

## 2023-05-08 MED ORDER — DILTIAZEM HCL ER COATED BEADS 240 MG PO CP24: 240.0000 mg | ORAL_CAPSULE | Freq: Every day | ORAL | Status: DC

## 2023-05-08 MED ORDER — OXYCODONE HCL 5 MG PO TABS: 5.0000 mg | ORAL_TABLET | Freq: Three times a day (TID) | ORAL | 0 refills | Status: AC | PRN

## 2023-05-08 MED ORDER — APIXABAN 5 MG PO TABS: 5.0000 mg | ORAL_TABLET | Freq: Two times a day (BID) | ORAL | Status: DC

## 2023-05-08 MED ORDER — DOCUSATE SODIUM 100 MG PO CAPS: 100.0000 mg | ORAL_CAPSULE | Freq: Two times a day (BID) | ORAL | 2 refills | Status: DC

## 2023-05-08 MED ORDER — METFORMIN HCL ER 500 MG PO TB24: 500.0000 mg | ORAL_TABLET | Freq: Every day | ORAL | Status: DC

## 2023-05-08 MED ORDER — AMIODARONE HCL 200 MG PO TABS: ORAL_TABLET | ORAL | Status: DC

## 2023-05-08 NOTE — TOC Transition Note (Addendum)
Transition of Care [facility]) - CM/SW Discharge Note   Patient Details  Name: Traci Koch MRN: [id number] Date of Birth: 14-Jul-1941  [facility]) CM/SW Contact:  Elliot Gault, LCSW Phone Number: 05/08/2023, 11:23 AM   Clinical Narrative:     Pt medically stable for dc to [facility] today per MD. Pt and family remain in agreement with the dc plan. Updated Kerri at [facility] and they can admit pt today.  DC clinical sent electronically. RN to call report.  Updated [facility] rep of change in plan.  There are no other TOC needs for dc.  Final next level of care: Skilled Nursing Facility Barriers to Discharge: Barriers Resolved   Patient Goals and CMS Choice CMS Medicare.gov Compare Post Acute Care list provided to:: Patient Choice offered to / list presented to : Patient  Discharge Placement PASRR number recieved: 05/07/23 PASRR number recieved: 05/07/23            Patient chooses bed at: [facility] Patient to be transferred to facility by: w/c Name of family member notified: Misty Stanley Patient and family notified of of transfer: 05/08/23  Discharge Plan and Services Additional resources added to the After Visit Summary for   In-house Referral: Clinical Social Work   Post Acute Care Choice: Skilled Nursing Facility                      [facility] Agency: [facility] Date [facility] Agency Contacted: 05/04/23 Time HH Agency Contacted: [id number] Representative spoke with at [facility] Agency: Maralyn Sago Cindie Laroche)  Social Determinants of Health (SDOH) Interventions SDOH Screenings   Food Insecurity: No Food Insecurity (05/04/2023)  Housing: Low Risk  (05/04/2023)  Transportation Needs: No Transportation Needs (05/04/2023)  Utilities: Not At Risk (05/04/2023)  Tobacco Use: Low Risk  (05/07/2023)     Readmission Risk Interventions    05/04/2023    1:51 PM  Readmission Risk Prevention Plan  Medication Screening Complete  Transportation Screening Complete

## 2023-05-08 NOTE — Discharge Summary (Addendum)
Physician Discharge Summary  Jahnia Holliday ZOX:[id number] DOB: 07-Jan-1941 DOA: 05/03/2023  PCP: Rebekah Chesterfield, NP Orthopedics: Dr. Dallas Schimke   Admit date: 05/03/2023 Discharge date: 05/08/2023  Admitted From:  Home  Disposition: [facility]   Recommendations for Outpatient Follow-up:  PLEASE REMOVE STAPLES IN 2 WEEKS PLEASE FOLLOW UP WITH DR. Dallas Schimke ORTHOPEDICS IN 4 WEEKS Please obtain CBC in one week Please recheck TSH in 1 month   ORTHOPEDIC PLAN AND INSTRUCTIONS PER DR CAIRNS: Weightbearing: WBAT RLE; Posterior hip precautions.  Insicional and dressing care: Reinforce dressings as needed Orthopedic device(s):  Hip abduction pillow in place while in bed VTE prophylaxis: At discretion of the primary team.  No orthopedic contraindications. Pain control: As needed.  Judicious use of narcotics.  Follow - up plan: Staples to be removed in 2 weeks.  Can follow up in 4-6 weeks with Dr. Dallas Schimke if no issues.   Home Health: SNF REHAB   Discharge Condition: STABLE   CODE STATUS: FULL DIET: CARB MODIFIED heart healthy     Brief Hospitalization Summary: Please see all hospital notes, images, labs for full details of the hospitalization. ADMISSION HPI: 82 year old female with history of hypertension, diabetes mellitus type 2, hypothyroidism presenting with mechanical fall.  The patient was getting ready for church when she got her pant leg caught on a dresser drawer.  This caused her to fall onto her right side.  She was having difficulty getting up secondary to pain.  She denies loss of consciousness or hitting her head.  The patient had been in her usual state of health without any complaints. Patient denies fevers, chills, headache, chest pain, dyspnea, nausea, vomiting, diarrhea, abdominal pain, dysuria, hematuria, hematochezia, and melena. In the ED, the patient was afebrile and hemodynamically stable with oxygen saturation 98% room air.  WBC 10.8, hemoglobin 14.3, platelets 227,000.   Sodium 139, potassium 3.6, bicarbonate 24, serum creatinine 0.82.  LFTs were unremarkable.  EKG showed sinus rhythm with no ST-T wave changes.  Chest x-ray showed slight increase in interstitial markings.  Orthopedics, Dr. Dallas Schimke was consulted and patient underwent Right hip hemiarthroplasty.   On 05/05/23 am, patient developed new onset atrial fibrillation with RVR.  Pt states she has no hx of dysrhythmias.  Cardiology was consulted to assist.  HOSPITAL COURSE BY PROBLEM   Right Intertrochanteric hip fracture -Ortho--Dr. Dallas Schimke consulted -05/04/23--POD#4 s/p ORIF right hip hemiarthroplasty -at baseline pt is completely independent with ADLS -able to function >4 METs -PT eval recommending SNF which has been arranged -PLEASE REMOVE STAPLES IN 2 WEEKS PER ORTHOPEDICS -FOLLOW UP WITH DR Dallas Schimke IN 4-6 WEEKS   New onset Atrial Fibrillation -new onset am 05/05/23 -no hx of dysrhythmias in past -started diltiazem infusion--transitioned to oral diltiazem 5/8 -holding ARB to allow BP margin for diltiazem titration -echocardiogram  -per ortho ok to anticoagulate, plan to start apixaban 5/9  -cardiology consulted, added amiodarone 5/8  -personally reviewed EKG--afib with RVR, nonspecific ST change -apixaban started 5 mg BID  -transitioned to oral cardizem CD 240 mg daily -oral amiodarone 200 mg BID for total of 7 days followed by 200 mg once daily afterwards  -outpatient follow up with cardiology arranged    Essential hypertension -hold ARB to allow BP margin for diltiazem titration -cardizem CD increased to 240 mg daily on 05/08/23    Diabetes mellitus type 2, controlled -resumed home Jardiance and metformin -restarted jardiance 5/8 -NovoLog sliding scale  CBG (last 3)  Recent Labs (last 2 labs)  Recent Labs    05/06/23 2134 05/07/23 0743 05/07/23 1151  GLUCAP 191* 154* 183*        Hyperthyroidism -TSH very suppressed -follow up free T4 -holding levothyroxine for now -not  planning to restart at discharge with very high free T4 noted      Family Communication:    Consultants:  ortho--[place]; cardiology   Code Status:  FULL   DVT Prophylaxis:  apixaban to start 5/9   Discharge Diagnoses:  Principal Problem:   Hip fracture [facility]) Active Problems:   Essential hypertension   Atrial fibrillation with RVR (HCC)   Discharge Instructions:  Allergies as of 05/08/2023       Reactions   Codeine Other (See Comments)   Hallucinations    Penicillins    Swelling    Tetanus-diphth-acell Pertussis Swelling        Medication List     STOP taking these medications    Biotin Plus Keratin 10000-100 MCG-MG Tabs   Cinnamon 500 MG capsule   ibuprofen 200 MG tablet Commonly known as: ADVIL   levothyroxine 25 MCG tablet Commonly known as: SYNTHROID   metFORMIN 500 MG tablet Commonly known as: GLUCOPHAGE Replaced by: metFORMIN 500 MG 24 hr tablet   olmesartan 40 MG tablet Commonly known as: BENICAR       TAKE these medications    acetaminophen 325 MG tablet Commonly known as: TYLENOL Take 2 tablets (650 mg total) by mouth every 6 (six) hours as needed for mild pain, fever or headache (or Fever >/= 101).   amiodarone 200 MG tablet Commonly known as: PACERONE 1 po BID thru 5/14, then 1 po daily   apixaban 5 MG Tabs tablet Commonly known as: ELIQUIS Take 1 tablet (5 mg total) by mouth 2 (two) times daily.   cholecalciferol 25 MCG (1000 UNIT) tablet Commonly known as: VITAMIN D3 Take 1,000 Units by mouth daily.   diltiazem 240 MG 24 hr capsule Commonly known as: CARDIZEM CD Take 1 capsule (240 mg total) by mouth daily. Start taking on: May 09, 2023   docusate sodium 100 MG capsule Commonly known as: Colace Take 1 capsule (100 mg total) by mouth 2 (two) times daily.   Jardiance 25 MG Tabs tablet Generic drug: empagliflozin Take 25 mg by mouth daily.   metFORMIN 500 MG 24 hr tablet Commonly known as: GLUCOPHAGE-XR Take 1 tablet  (500 mg total) by mouth daily with breakfast. Replaces: metFORMIN 500 MG tablet   methocarbamol 500 MG tablet Commonly known as: ROBAXIN Take 1 tablet (500 mg total) by mouth every 8 (eight) hours as needed for muscle spasms.   oxyCODONE 5 MG immediate release tablet Commonly known as: Oxy IR/ROXICODONE Take 1 tablet (5 mg total) by mouth 3 (three) times daily as needed for up to 3 days for severe pain or breakthrough pain.   PreserVision AREDS 2 Caps Take 1 tablet by mouth 2 (two) times daily.        Contact information for follow-up providers     [facility] Follow up.   Why: PT will call to schedule you first home visit.        Leone Brand, NP Follow up on 05/26/2023.   Specialties: Cardiology, Radiology Why: Cardiology Hospital Follow-up on 05/26/2023 at 2:30 PM. Contact information: [address] [place] [address] [phone]         Oliver Barre, MD. Schedule an appointment as soon as possible for a visit in 1 month(s).  Specialties: Orthopedic Surgery, Sports Medicine Why: Hospital Follow Up, For wound re-check Contact information: [address] [place] [address] [phone]              Contact information for after-discharge care     Destination     [facility] .   Service: Skilled Nursing Contact information: [address] [place] [address] [phone]                    Allergies  Allergen Reactions   Codeine Other (See Comments)    Hallucinations    Penicillins     Swelling    Tetanus-Diphth-Acell Pertussis Swelling   Allergies as of 05/08/2023       Reactions   Codeine Other (See Comments)   Hallucinations    Penicillins    Swelling    Tetanus-diphth-acell Pertussis Swelling        Medication List     STOP taking these medications    Biotin Plus Keratin 10000-100 MCG-MG Tabs   Cinnamon 500 MG capsule   ibuprofen 200 MG  tablet Commonly known as: ADVIL   levothyroxine 25 MCG tablet Commonly known as: SYNTHROID   metFORMIN 500 MG tablet Commonly known as: GLUCOPHAGE Replaced by: metFORMIN 500 MG 24 hr tablet   olmesartan 40 MG tablet Commonly known as: BENICAR       TAKE these medications    acetaminophen 325 MG tablet Commonly known as: TYLENOL Take 2 tablets (650 mg total) by mouth every 6 (six) hours as needed for mild pain, fever or headache (or Fever >/= 101).   amiodarone 200 MG tablet Commonly known as: PACERONE 1 po BID thru 5/14, then 1 po daily   apixaban 5 MG Tabs tablet Commonly known as: ELIQUIS Take 1 tablet (5 mg total) by mouth 2 (two) times daily.   cholecalciferol 25 MCG (1000 UNIT) tablet Commonly known as: VITAMIN D3 Take 1,000 Units by mouth daily.   diltiazem 240 MG 24 hr capsule Commonly known as: CARDIZEM CD Take 1 capsule (240 mg total) by mouth daily. Start taking on: May 09, 2023   docusate sodium 100 MG capsule Commonly known as: Colace Take 1 capsule (100 mg total) by mouth 2 (two) times daily.   Jardiance 25 MG Tabs tablet Generic drug: empagliflozin Take 25 mg by mouth daily.   metFORMIN 500 MG 24 hr tablet Commonly known as: GLUCOPHAGE-XR Take 1 tablet (500 mg total) by mouth daily with breakfast. Replaces: metFORMIN 500 MG tablet   methocarbamol 500 MG tablet Commonly known as: ROBAXIN Take 1 tablet (500 mg total) by mouth every 8 (eight) hours as needed for muscle spasms.   oxyCODONE 5 MG immediate release tablet Commonly known as: Oxy IR/ROXICODONE Take 1 tablet (5 mg total) by mouth 3 (three) times daily as needed for up to 3 days for severe pain or breakthrough pain.   PreserVision AREDS 2 Caps Take 1 tablet by mouth 2 (two) times daily.        Procedures/Studies: ECHOCARDIOGRAM COMPLETE  Result Date: 05/05/2023    ECHOCARDIOGRAM REPORT   Patient Name:   CHANTI LESS Date of Exam: 05/05/2023 Medical Rec #:  [id number]   Height:        63.0 in Accession #:    [id number]  Weight:       175.0 lb Date of Birth:  06/25/1941   BSA:          1.827 m Patient  Age:    82 years    BP:           130/41 mmHg Patient Gender: F           HR:           103 bpm. [facility] Procedure: 2D Echo, Cardiac Doppler and Color Doppler Indications:    Atrial Fibrillation I48.91  History:        Patient has no prior history of Echocardiogram examinations.                 Arrythmias:Atrial Fibrillation; Risk Factors:Hypertension and                 Diabetes.  Sonographer:    Aron Baba Referring Phys: [phone] DAVID TAT  Sonographer Comments: Image acquisition challenging due to patient body habitus. IMPRESSIONS  1. Left ventricular ejection fraction, by estimation, is 65 to 70%. The left ventricle has normal function. The left ventricle has no regional wall motion abnormalities. There is mild left ventricular hypertrophy. Left ventricular diastolic parameters are indeterminate.  2. Right ventricular systolic function is normal. The right ventricular size is normal. There is mildly elevated pulmonary artery systolic pressure.  3. The mitral valve is normal in structure. No evidence of mitral valve regurgitation. No evidence of mitral stenosis.  4. The aortic valve is tricuspid. Aortic valve regurgitation is not visualized. No aortic stenosis is present.  5. The inferior vena cava is normal in size with <50% respiratory variability, suggesting right atrial pressure of 8 mmHg. FINDINGS  Left Ventricle: Left ventricular ejection fraction, by estimation, is 65 to 70%. The left ventricle has normal function. The left ventricle has no regional wall motion abnormalities. The left ventricular internal cavity size was normal in size. There is  mild left ventricular hypertrophy. Left ventricular diastolic parameters are indeterminate. Right Ventricle: The right ventricular size is normal. Right vetricular wall thickness was not well visualized. Right ventricular systolic  function is normal. There is mildly elevated pulmonary artery systolic pressure. The tricuspid regurgitant velocity  is 2.87 m/s, and with an assumed right atrial pressure of 8 mmHg, the estimated right ventricular systolic pressure is 40.9 mmHg. Left Atrium: Left atrial size was normal in size. Right Atrium: Right atrial size was normal in size. Pericardium: There is no evidence of pericardial effusion. Mitral Valve: The mitral valve is normal in structure. No evidence of mitral valve regurgitation. No evidence of mitral valve stenosis. Tricuspid Valve: The tricuspid valve is normal in structure. Tricuspid valve regurgitation is mild . No evidence of tricuspid stenosis. Aortic Valve: The aortic valve is tricuspid. Aortic valve regurgitation is not visualized. No aortic stenosis is present. Aortic valve mean gradient measures 6.9 mmHg. Aortic valve peak gradient measures 13.2 mmHg. Aortic valve area, by VTI measures 1.98  cm. Pulmonic Valve: The pulmonic valve was not well visualized. Pulmonic valve regurgitation is not visualized. No evidence of pulmonic stenosis. Aorta: The aortic root and ascending aorta are structurally normal, with no evidence of dilitation. Venous: The inferior vena cava is normal in size with less than 50% respiratory variability, suggesting right atrial pressure of 8 mmHg. IAS/Shunts: No atrial level shunt detected by color flow Doppler.  LEFT VENTRICLE PLAX 2D LVIDd:         4.30 cm   Diastology LVIDs:         2.90 cm   LV e' medial:    7.80 cm/s LV PW:  1.10 cm   LV E/e' medial:  16.7 LV IVS:        1.10 cm   LV e' lateral:   9.45 cm/s LVOT diam:     1.60 cm   LV E/e' lateral: 13.8 LV SV:         58 LV SV Index:   32 LVOT Area:     2.01 cm  RIGHT VENTRICLE RV S prime:     19.60 cm/s TAPSE (M-mode): 2.0 cm LEFT ATRIUM             Index        RIGHT ATRIUM           Index LA diam:        4.20 cm 2.30 cm/m   RA Area:     11.00 cm LA Vol (A2C):   46.8 ml 25.62 ml/m  RA Volume:    25.10 ml  13.74 ml/m LA Vol (A4C):   55.5 ml 30.38 ml/m LA Biplane Vol: 51.8 ml 28.35 ml/m  AORTIC VALVE AV Area (Vmax):    1.91 cm AV Area (Vmean):   1.92 cm AV Area (VTI):     1.98 cm AV Vmax:           181.89 cm/s AV Vmean:          121.414 cm/s AV VTI:            0.292 m AV Peak Grad:      13.2 mmHg AV Mean Grad:      6.9 mmHg LVOT Vmax:         173.00 cm/s LVOT Vmean:        116.000 cm/s LVOT VTI:          0.288 m LVOT/AV VTI ratio: 0.99  AORTA Ao Root diam: 3.50 cm Ao Asc diam:  3.30 cm MITRAL VALVE                TRICUSPID VALVE MV Area (PHT): 2.56 cm     TR Peak grad:   32.9 mmHg MV Decel Time: 296 msec     TR Vmax:        287.00 cm/s MR Peak grad: 12.8 mmHg MR Vmax:      179.00 cm/s   SHUNTS MV E velocity: 130.00 cm/s  Systemic VTI:  0.29 m                             Systemic Diam: 1.60 cm Dina Rich MD Electronically signed by Dina Rich MD Signature Date/Time: 05/05/2023/3:20:13 PM    Final    DG HIP UNILAT WITH PELVIS 2-3 VIEWS RIGHT  Result Date: 05/04/2023 CLINICAL DATA:  Closed displaced fracture of the right femoral neck. Postop images. EXAM: DG HIP (WITH OR WITHOUT PELVIS) 2-3V RIGHT COMPARISON:  05/03/2023 FINDINGS: Patient has undergone a right hip arthroplasty. The right femoral stem is completely visualized. Right hip appears to be located and no evidence for a periprosthetic fracture. Lucency in the right hip soft tissues compatible with recent surgery and skin staples are present. No gross abnormality to the left hip. Pelvic bony ring appears to be intact. IMPRESSION: Status post right hip arthroplasty.  No complicating features. Electronically Signed   By: Richarda Overlie M.D.   On: 05/04/2023 14:31   DG HIP UNILAT WITH PELVIS 2-3 VIEWS RIGHT  Result Date: 05/04/2023 CLINICAL DATA:  [id number] Hip fx, right, closed, initial encounter [facility]) [id number]  EXAM: DG HIP (WITH OR WITHOUT PELVIS) 2-3V RIGHT COMPARISON:  05/03/2023 FINDINGS: Intraoperative images demonstrated a displaced right  femoral neck fracture. IMPRESSION: Right femoral neck fracture on intraoperative images. Electronically Signed   By: Caprice Renshaw M.D.   On: 05/04/2023 14:14   DG C-Arm 1-60 Min-No Report  Result Date: 05/04/2023 Fluoroscopy was utilized by the requesting physician.  No radiographic interpretation.   DG Chest 1 View  Result Date: 05/03/2023 CLINICAL DATA:  Larey Seat, right hip fracture EXAM: CHEST  1 VIEW COMPARISON:  None Available. FINDINGS: Single frontal view of the chest demonstrates mild enlargement of the cardiac silhouette. There is central pulmonary vascular congestion, without airspace disease, effusion, or pneumothorax. No acute bony abnormalities. IMPRESSION: 1. Enlarged cardiac silhouette, with central pulmonary vascular congestion. No overt edema. Electronically Signed   By: Sharlet Salina M.D.   On: 05/03/2023 15:04   DG Hip Unilat W or Wo Pelvis 2-3 Views Right  Result Date: 05/03/2023 CLINICAL DATA:  Larey Seat, fracture EXAM: DG HIP (WITH OR WITHOUT PELVIS) 2-3V RIGHT COMPARISON:  None Available. FINDINGS: Frontal view of the pelvis as well as frontal and cross-table lateral views of the right hip are obtained. There is a comminuted intertrochanteric right hip fracture with marked impaction and varus angulation at the fracture site. No dislocation. Symmetrical bilateral hip osteoarthritis. Remainder of the bony pelvis is unremarkable. IMPRESSION: 1. Acute comminuted intertrochanteric right hip fracture, with impaction and varus angulation at the fracture site. Electronically Signed   By: Sharlet Salina M.D.   On: 05/03/2023 15:03     Subjective: Pt reports she is eager to start rehab, no palpitations, no CP, no SOB.    Discharge Exam: Vitals:   05/07/23 2325 05/08/23 0823  BP: (!) 142/64 135/70  Pulse: (!) 107 91  Resp: 15 18  Temp: 99.2 F (37.3 C)   SpO2: 95% 96%   Vitals:   05/07/23 1518 05/07/23 1948 05/07/23 2325 05/08/23 0823  BP: 122/72 (!) 142/64 (!) 142/64 135/70  Pulse: 95  (!) 104 (!) 107 91  Resp: 20 20 15 18   Temp: 98 F (36.7 C) 99.7 F (37.6 C) 99.2 F (37.3 C)   TempSrc: Oral  Oral   SpO2: 96% 96% 95% 96%  Weight:      Height:       General: Pt is alert, awake, not in acute distress Cardiovascular: normal S1/S2 +, no rubs, no gallops Respiratory: CTA bilaterally, no wheezing, no rhonchi Abdominal: Soft, NT, ND, bowel sounds + Extremities: no edema, no cyanosis   The results of significant diagnostics from this hospitalization (including imaging, microbiology, ancillary and laboratory) are listed below for reference.     Microbiology: Recent Results (from the past 240 hour(s))  MRSA Next Gen by PCR, Nasal     Status: None   Collection Time: 05/03/23 10:40 PM   Specimen: Nasal Mucosa; Nasal Swab  Result Value Ref Range Status   MRSA by PCR Next Gen NOT DETECTED NOT DETECTED Final    Comment: (NOTE) The GeneXpert MRSA Assay (FDA approved for NASAL specimens only), is one component of a comprehensive MRSA colonization surveillance program. It is not intended to diagnose MRSA infection nor to guide or monitor treatment for MRSA infections. Test performance is not FDA approved in patients less than 81 years old. Performed at [facility], [address], [place], [place] [address]      Labs: BNP (last 3 results) No results for input(s): "BNP" in the last 8760 hours. Basic  Metabolic Panel: Recent Labs  Lab 05/03/23 1406 05/04/23 0500 05/05/23 0501 05/06/23 0419 05/07/23 0451  NA 139 138 137 136 136  K 3.6 3.7 3.9 3.9 3.7  CL 104 105 104 107 109  CO2 24 25 21* 17* 20*  GLUCOSE 201* 150* 129* 139* 140*  BUN 17 16 19 22  26*  CREATININE 0.82 0.75 0.87 0.81 0.84  CALCIUM 9.0 8.6* 8.3* 8.0* 8.3*  MG  --   --   --  2.0 2.1   Liver Function Tests: Recent Labs  Lab 05/03/23 1406 05/04/23 0500  AST 22 16  ALT 18 14  ALKPHOS 71 60  BILITOT 0.7 1.1  PROT 6.6 5.9*  ALBUMIN 3.8 3.2*   No results for input(s): "LIPASE", "AMYLASE" in  the last 168 hours. No results for input(s): "AMMONIA" in the last 168 hours. CBC: Recent Labs  Lab 05/03/23 1406 05/04/23 0500 05/05/23 0501 05/06/23 0629 05/07/23 0451  WBC 10.8* 6.8 9.5 8.6 8.4  NEUTROABS 9.6*  --   --   --   --   HGB 14.3 13.1 11.8* 10.0* 10.1*  HCT 45.1 40.9 37.6 31.9* 32.0*  MCV 89.7 90.3 93.3 93.3 92.8  PLT 227 189 197 169 159   Cardiac Enzymes: No results for input(s): "CKTOTAL", "CKMB", "CKMBINDEX", "TROPONINI" in the last 168 hours. BNP: Invalid input(s): "POCBNP" CBG: Recent Labs  Lab 05/07/23 0743 05/07/23 1151 05/07/23 1638 05/07/23 2141 05/08/23 0706  GLUCAP 154* 183* 162* 150* 129*   D-Dimer No results for input(s): "DDIMER" in the last 72 hours. Hgb A1c No results for input(s): "HGBA1C" in the last 72 hours. Lipid Profile No results for input(s): "CHOL", "HDL", "LDLCALC", "TRIG", "CHOLHDL", "LDLDIRECT" in the last 72 hours. Thyroid function studies Recent Labs    05/06/23 0419  TSH 0.119*   Anemia work up No results for input(s): "VITAMINB12", "FOLATE", "FERRITIN", "TIBC", "IRON", "RETICCTPCT" in the last 72 hours. Urinalysis No results found for: "COLORURINE", "APPEARANCEUR", "LABSPEC", "PHURINE", "GLUCOSEU", "HGBUR", "BILIRUBINUR", "KETONESUR", "PROTEINUR", "UROBILINOGEN", "NITRITE", "LEUKOCYTESUR" Sepsis Labs Recent Labs  Lab 05/04/23 0500 05/05/23 0501 05/06/23 0629 05/07/23 0451  WBC 6.8 9.5 8.6 8.4   Microbiology Recent Results (from the past 240 hour(s))  MRSA Next Gen by PCR, Nasal     Status: None   Collection Time: 05/03/23 10:40 PM   Specimen: Nasal Mucosa; Nasal Swab  Result Value Ref Range Status   MRSA by PCR Next Gen NOT DETECTED NOT DETECTED Final    Comment: (NOTE) The GeneXpert MRSA Assay (FDA approved for NASAL specimens only), is one component of a comprehensive MRSA colonization surveillance program. It is not intended to diagnose MRSA infection nor to guide or monitor treatment for MRSA  infections. Test performance is not FDA approved in patients less than 88 years old. Performed at [facility], [address], [place], [place] [address]     Time coordinating discharge: 41 mins   SIGNED:  Standley Dakins, MD  [facility] 05/08/2023, 10:47 AM How to contact the [facility] Attending or Consulting provider 7A - 7P or covering provider during after hours 7P -7A, for this patient?  Check the care team in [facility] and look for a) attending/consulting [facility] provider listed and b) the [facility] team listed Log into [email or web address] and use [place]'s universal password to access. If you do not have the password, please contact the hospital operator. Locate the [facility] provider you are looking for under [facility] and page to a number that you can be directly reached. If  you still have difficulty reaching the provider, please page the [facility] (Director on Call) for the Hospitalists listed on amion for assistance.

## 2023-05-08 NOTE — Care Management Important Message (Signed)
Important Message  Patient Details  Name: Traci Koch MRN: [id number] Date of Birth: 09-May-1941   Medicare Important Message Given:  Yes     Corey Harold 05/08/2023, 10:18 AM

## 2023-05-08 NOTE — Progress Notes (Signed)
Physical Therapy Treatment Patient Details Name: Traci Koch MRN: [id number] DOB: February 24, 1941 Today's Date: 05/08/2023   History of Present Illness Traci Koch  is a 82 y.o. female s/p Right hip hemiarthroplasty on 05/04/23, with medical history of diabetes mellitus type 2, hypertension, hypothyroidism who came to hospital after patient had fall at home.  Patient said that she was getting ready to go to church when her pants got caught in the drawer causing her to fall.  She fell on the right side, called her son who brought her to the hospital.  Patient admits to having pain in the right hip area.  X-ray of the urgent care was done and patient was advised to go to the ER for further evaluation.  In the ED x-ray of the hip shows right hip fracture.  Orthopedic surgery has been consulted by ED provider.  Plan for ORIF in a.m.    PT Comments    Patient demonstrates slight improvement for moving RLE during bed mobility, fair/good return for completing BLE exercises while seated at EOB, very unsteady on feet and limited to a few side steps with frequent shuffling of RLE due to increased pain and tolerated sitting  up in chair after therapy.  Patient will benefit from continued skilled physical therapy in hospital and recommended venue below to increase strength, balance, endurance for safe ADLs and gait.     Recommendations for follow up therapy are one component of a multi-disciplinary discharge planning process, led by the attending physician.  Recommendations may be updated based on patient status, additional functional criteria and insurance authorization.  Follow Up Recommendations  Can patient physically be transported by private vehicle: No    Assistance Recommended at Discharge Set up Supervision/Assistance  Patient can return home with the following A lot of help with bathing/dressing/bathroom;A lot of help with walking and/or transfers;Help with stairs or ramp for entrance;Assistance with  cooking/housework   Equipment Recommendations  Rolling walker (2 wheels)    Recommendations for Other Services       Precautions / Restrictions Precautions Precautions: Fall Precaution Booklet Issued: No Required Braces or Orthoses: Other Brace Other Brace: abduction pillow when in bed Restrictions Weight Bearing Restrictions: Yes RLE Weight Bearing: Weight bearing as tolerated     Mobility  Bed Mobility Overal bed mobility: Needs Assistance Bed Mobility: Supine to Sit     Supine to sit: Mod assist     General bed mobility comments: slight improvement for moving RLE during supine to sitting    Transfers Overall transfer level: Needs assistance Equipment used: Rolling walker (2 wheels) Transfers: Sit to/from Stand, Bed to chair/wheelchair/BSC Sit to Stand: Mod assist   Step pivot transfers: Mod assist, Max assist       General transfer comment: slow labored movement with difficulty moving RLE due to increased pain and required frequent verbal cues for proper hand placement on RW    Ambulation/Gait Ambulation/Gait assistance: Mod assist, Max assist Gait Distance (Feet): 6 Feet Assistive device: Rolling walker (2 wheels) Gait Pattern/deviations: Decreased step length - right, Decreased step length - left, Decreased stride length, Decreased stance time - right, Antalgic, Knees buckling, Shuffle Gait velocity: slow     General Gait Details: slow labored unsteady movement with poor tolerance for weightbearing on RLE and limited due to generalized weakness   Stairs             Wheelchair Mobility    Modified Rankin (Stroke Patients Only)       Balance Overall  balance assessment: Needs assistance Sitting-balance support: Feet supported, No upper extremity supported Sitting balance-Leahy Scale: Poor Sitting balance - Comments: fair/good seated at EOB   Standing balance support: Reliant on assistive device for balance, During functional activity,  Bilateral upper extremity supported Standing balance-Leahy Scale: Poor Standing balance comment: using RW                            Cognition Arousal/Alertness: Awake/alert Behavior During Therapy: WFL for tasks assessed/performed Overall Cognitive Status: Within Functional Limits for tasks assessed                                          Exercises General Exercises - Lower Extremity Long Arc Quad: Seated, AROM, Strengthening, Both, 10 reps Hip Flexion/Marching: Seated, AROM, AAROM, Strengthening, Both, 10 reps Toe Raises: Seated, AROM, Strengthening, Both, 10 reps Heel Raises: Seated, AROM, Strengthening, Both, 10 reps    General Comments        Pertinent Vitals/Pain Pain Assessment Pain Assessment: Faces Faces Pain Scale: Hurts even more Pain Location: right hip with movement Pain Descriptors / Indicators: Sore, Grimacing, Guarding, Discomfort Pain Intervention(s): Limited activity within patient's tolerance, Monitored during session, Premedicated before session, Repositioned    Home Living                          Prior Function            PT Goals (current goals can now be found in the care plan section) Acute Rehab PT Goals Patient Stated Goal: return home after rehab PT Goal Formulation: With patient Time For Goal Achievement: 05/20/23 Potential to Achieve Goals: Good Progress towards PT goals: Progressing toward goals    Frequency    Min 3X/week      PT Plan Current plan remains appropriate    Co-evaluation              AM-PAC PT "6 Clicks" Mobility   Outcome Measure  Help needed turning from your back to your side while in a flat bed without using bedrails?: A Lot Help needed moving from lying on your back to sitting on the side of a flat bed without using bedrails?: A Lot Help needed moving to and from a bed to a chair (including a wheelchair)?: A Lot Help needed standing up from a chair using your  arms (e.g., wheelchair or bedside chair)?: A Lot Help needed to walk in hospital room?: A Lot Help needed climbing 3-5 steps with a railing? : Total 6 Click Score: 11    End of Session   Activity Tolerance: Patient tolerated treatment well;Patient limited by fatigue Patient left: in chair;with call bell/phone within reach Nurse Communication: Mobility status PT Visit Diagnosis: Unsteadiness on feet (R26.81);Other abnormalities of gait and mobility (R26.89);Muscle weakness (generalized) (M62.81)     Time: 1610-9604 PT Time Calculation (min) (ACUTE ONLY): 20 min  Charges:  $Therapeutic Exercise: 8-22 mins $Therapeutic Activity: 8-22 mins                     12:30 PM, 05/08/23 Ocie Bob, MPT Physical Therapist with [facility] [phone] office [phone] mobile phone

## 2023-05-08 NOTE — Plan of Care (Signed)

## 2023-05-08 NOTE — Progress Notes (Signed)
Progress Note  Patient Name: Traci Koch Date of Encounter: 05/08/2023  Primary Cardiologist: Dina Rich, MD  Interval Summary   Patient on telemetry unit at this point.  No palpitations or chest pain, no shortness of breath.  Has been tolerating PT and plan is for transition to [facility] for additional rehabilitation.  Vital Signs    Vitals:   05/07/23 1518 05/07/23 1948 05/07/23 2325 05/08/23 0823  BP: 122/72 (!) 142/64 (!) 142/64 135/70  Pulse: 95 (!) 104 (!) 107 91  Resp: 20 20 15 18   Temp: 98 F (36.7 C) 99.7 F (37.6 C) 99.2 F (37.3 C)   TempSrc: Oral  Oral   SpO2: 96% 96% 95% 96%  Weight:      Height:        Intake/Output Summary (Last 24 hours) at 05/08/2023 0901 Last data filed at 05/07/2023 1900 Gross per 24 hour  Intake 480 ml  Output 700 ml  Net -220 ml   Filed Weights   05/03/23 1349 05/04/23 0809 05/06/23 0611  Weight: 79.4 kg 79.4 kg 82.8 kg    Physical Exam   GEN: No acute distress.   Neck: No JVD. Cardiac: Irregularly irregular without murmur or gallop.  Respiratory: Nonlabored. Clear to auscultation bilaterally. GI: Soft, nontender, bowel sounds present. MS: No pitting edema.  ECG/Telemetry    Telemetry reviewed showing atrial fibrillation with some RVR.  Labs    Chemistry Recent Labs  Lab 05/03/23 1406 05/04/23 0500 05/05/23 0501 05/06/23 0419 05/07/23 0451  NA 139 138 137 136 136  K 3.6 3.7 3.9 3.9 3.7  CL 104 105 104 107 109  CO2 24 25 21* 17* 20*  GLUCOSE 201* 150* 129* 139* 140*  BUN 17 16 19 22  26*  CREATININE 0.82 0.75 0.87 0.81 0.84  CALCIUM 9.0 8.6* 8.3* 8.0* 8.3*  PROT 6.6 5.9*  --   --   --   ALBUMIN 3.8 3.2*  --   --   --   AST 22 16  --   --   --   ALT 18 14  --   --   --   ALKPHOS 71 60  --   --   --   BILITOT 0.7 1.1  --   --   --   GFRNONAA >60 >60 >60 >60 >60  ANIONGAP 11 8 12 12 7     Hematology Recent Labs  Lab 05/05/23 0501 05/06/23 0629 05/07/23 0451  WBC 9.5 8.6 8.4  RBC 4.03 3.42*  3.45*  HGB 11.8* 10.0* 10.1*  HCT 37.6 31.9* 32.0*  MCV 93.3 93.3 92.8  MCH 29.3 29.2 29.3  MCHC 31.4 31.3 31.6  RDW 15.3 15.4 15.3  PLT 197 169 159    Cardiac Studies   Echocardiogram 05/05/2023:  1. Left ventricular ejection fraction, by estimation, is 65 to 70%. The  left ventricle has normal function. The left ventricle has no regional  wall motion abnormalities. There is mild left ventricular hypertrophy.  Left ventricular diastolic parameters  are indeterminate.   2. Right ventricular systolic function is normal. The right ventricular  size is normal. There is mildly elevated pulmonary artery systolic  pressure.   3. The mitral valve is normal in structure. No evidence of mitral valve  regurgitation. No evidence of mitral stenosis.   4. The aortic valve is tricuspid. Aortic valve regurgitation is not  visualized. No aortic stenosis is present.   5. The inferior vena cava is normal in size with <50%  respiratory  variability, suggesting right atrial pressure of 8 mmHg.   Assessment & Plan   1.  Postoperative atrial fibrillation with RVR, CHA2DS2-VASc score is 5.  LVEF 65 to 70% without regional wall motion abnormalities by echocardiogram.  Left atrium described as normal in size.  She continues on amiodarone, Cardizem CD, and Eliquis.  2.  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  3.  Essential hypertension.  On Benicar as an outpatient, currently held and with systolics ranging 130-140.  4.  Type 2 diabetes mellitus, on Jardiance and Glucophage as an outpatient.  5.  Hypothyroidism on Synthroid as an outpatient.  Recent TSH 0.119.  Synthroid held.  Increase Cardizem CD to 240 mg daily, first dose this morning.  Continue amiodarone 200 mg twice daily for total of 7 days and then decrease to once daily dosing.  Continue Eliquis for stroke prophylaxis.  She should still be able to transition to [facility] for rehabilitation presuming heart rate is controlled.  We will  arrange follow-up for review of her atrial fibrillation and determination if elective cardioversion needs to be considered as an outpatient.  For questions or updates, please contact [facility] Please consult [email or web address] for contact info under   Signed, Nona Dell, MD  05/08/2023, 9:01 AM

## 2023-05-11 ENCOUNTER — Non-Acute Institutional Stay (SKILLED_NURSING_FACILITY): Payer: Medicare Other | Admitting: Adult Health

## 2023-05-11 ENCOUNTER — Encounter: Payer: Self-pay | Admitting: Adult Health

## 2023-05-11 DIAGNOSIS — N182 Chronic kidney disease, stage 2 (mild): Secondary | ICD-10-CM

## 2023-05-11 DIAGNOSIS — S72001D Fracture of unspecified part of neck of right femur, subsequent encounter for closed fracture with routine healing: Secondary | ICD-10-CM

## 2023-05-11 DIAGNOSIS — K5909 Other constipation: Secondary | ICD-10-CM

## 2023-05-11 DIAGNOSIS — E039 Hypothyroidism, unspecified: Secondary | ICD-10-CM | POA: Insufficient documentation

## 2023-05-11 DIAGNOSIS — I129 Hypertensive chronic kidney disease with stage 1 through stage 4 chronic kidney disease, or unspecified chronic kidney disease: Secondary | ICD-10-CM

## 2023-05-11 DIAGNOSIS — E1122 Type 2 diabetes mellitus with diabetic chronic kidney disease: Secondary | ICD-10-CM | POA: Diagnosis not present

## 2023-05-11 DIAGNOSIS — I4891 Unspecified atrial fibrillation: Secondary | ICD-10-CM

## 2023-05-11 NOTE — Progress Notes (Signed)
Location:  [facility] Room Number: 134 Place of Service:  SNF (31)   CODE STATUS: full    Allergies  Allergen Reactions   Codeine Other (See Comments)    Hallucinations    Penicillins     Swelling    Tetanus-Diphth-Acell Pertussis Swelling    Chief Complaint  Patient presents with   Hospitalization Follow-up    HPI:  She is a 82 year old woman who has been hospitalized from 05-03-23 through 05-08-23. Her medical history includes: hypertension; diabetes type 2. She presented to the ED after sustaining a mechanical fall. Her pant leg got caught in her dresser. She did not lose consciousness. She underwent right hip arthroplasty bipolar hemiarthroplasty. She developed new onset atrial fibrillation with rvr. She was seen by cardiology. She started on cardizem drip and transitioned to po. She was started on eliquis 5 mg twice daily and amiodarone. Her ARB was stopped while in the hospital. She was completely independent with her adl care prior to her hospitalization. She is here for short term rehab with her goal to return back home. She does have some right hip tenderness. She will continue to be followed for her chronic illnesses including:  Atrial fibrillation with RVR:  Type 2 diabetes mellitus with  stage 2 chronic kidney disease and hypertension:  Hypertension associated with stage 2 chronic kidney disease due to type 2 diabetes mellitus:  Hypothyroidism (acquired)     Past Medical History:  Diagnosis Date   Diabetes mellitus without complication (HCC)    Hypertension    Thyroid disease     Past Surgical History:  Procedure Laterality Date   HIP ARTHROPLASTY Right 05/04/2023   Procedure: ARTHROPLASTY BIPOLAR HIP (HEMIARTHROPLASTY);  Surgeon: Oliver Barre, MD;  Location: [facility];  Service: Orthopedics;  Laterality: Right;    Social History   Socioeconomic History   Marital status: Unknown    Spouse name: Not on file   Number of children: Not on file    Years of education: Not on file   Highest education level: Not on file  Occupational History   Not on file  Tobacco Use   Smoking status: Never   Smokeless tobacco: Never  Vaping Use   Vaping Use: Never used  Substance and Sexual Activity   Alcohol use: Never   Drug use: Never   Sexual activity: Not Currently  Other Topics Concern   Not on file  Social History Narrative   Not on file   Social Determinants of Health   Financial Resource Strain: Not on file  Food Insecurity: No Food Insecurity (05/04/2023)   Hunger Vital Sign    Worried About Running Out of Food in the Last Year: Never true    Ran Out of Food in the Last Year: Never true  Transportation Needs: No Transportation Needs (05/04/2023)   PRAPARE - Administrator, Civil Service (Medical): No    Lack of Transportation (Non-Medical): No  Physical Activity: Not on file  Stress: Not on file  Social Connections: Not on file  Intimate Partner Violence: Not At Risk (05/04/2023)   Humiliation, Afraid, Rape, and Kick questionnaire    Fear of Current or Ex-Partner: No    Emotionally Abused: No    Physically Abused: No    Sexually Abused: No   No family history on file.    VITAL SIGNS BP 132/86   Pulse 70   Temp 97.7 F (36.5 C)   Resp 16  Ht 5\' 3"  (1.6 m)   Wt 177 lb 14.4 oz (80.7 kg)   LMP  (LMP Unknown) Comment: menopausal  SpO2 97%   BMI 31.51 kg/m   Outpatient Encounter Medications as of 05/11/2023  Medication Sig Note   acetaminophen (TYLENOL) 325 MG tablet Take 2 tablets (650 mg total) by mouth every 6 (six) hours as needed for mild pain, fever or headache (or Fever >/= 101).    amiodarone (PACERONE) 200 MG tablet 1 po BID thru 5/14, then 1 po daily    apixaban (ELIQUIS) 5 MG TABS tablet Take 1 tablet (5 mg total) by mouth 2 (two) times daily.    cholecalciferol (VITAMIN D3) 25 MCG (1000 UNIT) tablet Take 1,000 Units by mouth daily.    diltiazem (CARDIZEM CD) 240 MG 24 hr capsule Take 1  capsule (240 mg total) by mouth daily.    docusate sodium (COLACE) 100 MG capsule Take 1 capsule (100 mg total) by mouth 2 (two) times daily.    empagliflozin (JARDIANCE) 25 MG TABS tablet Take 25 mg by mouth daily. 05/03/2023: Doctor gave samples   metFORMIN (GLUCOPHAGE-XR) 500 MG 24 hr tablet Take 1 tablet (500 mg total) by mouth daily with breakfast.    methocarbamol (ROBAXIN) 500 MG tablet Take 1 tablet (500 mg total) by mouth every 8 (eight) hours as needed for muscle spasms.    Multiple Vitamins-Minerals (PRESERVISION AREDS 2) CAPS Take 1 tablet by mouth 2 (two) times daily.    oxyCODONE (OXY IR/ROXICODONE) 5 MG immediate release tablet Take 1 tablet (5 mg total) by mouth 3 (three) times daily as needed for up to 3 days for severe pain or breakthrough pain.    No facility-administered encounter medications on file as of 05/11/2023.     SIGNIFICANT DIAGNOSTIC EXAMS  TODAY  05-03-23: right hip x-ray:  1. Acute comminuted intertrochanteric right hip fracture, with impaction and varus angulation at the fracture site.  05-03-23: chest x-ray:  1. Enlarged cardiac silhouette, with central pulmonary vascular congestion. No overt edema.  LABS REVIEWED;   05-03-23; wbc 10.8; hgb 14.3; hct 45.1; mcv 98.7 plt 226; glucose 201 bun 17; creat 0.82; k+ 3.6; na++ 139; ca 9.0; gfr >60; protein 6.6 albumin 3.8 05-05-23: wbc 9.5; hg 11.8; hct 37.6; mcv 93.3 plt 197; glucose 129; bun 19; creat 0.87; k+ 3.9; na++ 137; ca 8.3 gfr >60; hgb A1c 7.2 05-06-23: glucose 139; bun 22; creat 0.81; k+ 3.9; na++ 136; ca 8.0; gfr >60 tsh 0.119' mag 2.0 05-07-23: wbc 8.4; hgb 10.1; hct 32.0 mcv 92.8 plt 159 glucose 140; bun 26; creat 0.84; k+ 3.7; na++ 136; ca 8.3 gfr >60; mag 2.1 free t4: 1.31   Review of Systems  Constitutional:  Negative for malaise/fatigue.  Respiratory:  Negative for cough and shortness of breath.   Cardiovascular:  Negative for chest pain, palpitations and leg swelling.  Gastrointestinal:  Positive for  constipation. Negative for abdominal pain and heartburn.  Musculoskeletal:  Negative for back pain, joint pain and myalgias.       Right hip sore   Skin: Negative.   Neurological:  Negative for dizziness.  Psychiatric/Behavioral:  The patient is not nervous/anxious.    Physical Exam Constitutional:      General: She is not in acute distress.    Appearance: She is well-developed. She is obese. She is not diaphoretic.  Neck:     Thyroid: No thyromegaly.  Cardiovascular:     Rate and Rhythm: Normal rate and regular rhythm.  Pulses: Normal pulses.     Heart sounds: Murmur heard.  Pulmonary:     Effort: Pulmonary effort is normal. No respiratory distress.     Breath sounds: Normal breath sounds.  Abdominal:     General: Bowel sounds are normal. There is no distension.     Palpations: Abdomen is soft.     Tenderness: There is no abdominal tenderness.  Musculoskeletal:     Cervical back: Neck supple.     Right lower leg: No edema.     Left lower leg: No edema.     Comments: Able to move all extremities s/p right hip fracture   Lymphadenopathy:     Cervical: No cervical adenopathy.  Skin:    General: Skin is warm and dry.     Comments: Incision line without signs of infection present   Neurological:     Mental Status: She is alert and oriented to person, place, and time.  Psychiatric:        Mood and Affect: Mood normal.       ASSESSMENT/ PLAN:  TODAY  Closed fracture of right hip with routine healing subsequent encounter: will continue therapy as directed to improve upon her level of independence with her adls; will follow up with orthopedics. She has robaxin 500 mg every 8 hours as needed; her oxycodone will expire today.   2. Atrial fibrillation with RVR: heart rate is stable; irregular; will continue eliquis 5 mg twice daily; will continue cardizem cd 240 mg daily and amiodarone 200 mg twice daily through 05-12-23 then 200 mg daily   3. Type 2 diabetes mellitus with   stage 2 chronic kidney disease and hypertension: hgb A1c 7.2 will continue jardiance 25 mg daily and metformin xr 500 mg daily   4. Hypertension associated with stage 2 chronic kidney disease due to type 2 diabetes mellitus: b/p 132/86 will continue cardizem cd 240 mg daily; her ARB was stopped in the hospital.   5. Hypothyroidism (acquired) her tsh is 0.119 with free t4: 1.31 her synthroid is on hold at this time; will repeat labs in one month  6. Chronic constipation: will continue colace twice daily  and will add miralax daily    Will repeat labs.      Synthia Innocent NP [facility]  call [phone]

## 2023-05-12 ENCOUNTER — Non-Acute Institutional Stay (SKILLED_NURSING_FACILITY): Payer: Medicare Other | Admitting: Internal Medicine

## 2023-05-12 ENCOUNTER — Encounter: Payer: Self-pay | Admitting: Internal Medicine

## 2023-05-12 DIAGNOSIS — E039 Hypothyroidism, unspecified: Secondary | ICD-10-CM

## 2023-05-12 DIAGNOSIS — E1122 Type 2 diabetes mellitus with diabetic chronic kidney disease: Secondary | ICD-10-CM

## 2023-05-12 DIAGNOSIS — E441 Mild protein-calorie malnutrition: Secondary | ICD-10-CM

## 2023-05-12 DIAGNOSIS — I129 Hypertensive chronic kidney disease with stage 1 through stage 4 chronic kidney disease, or unspecified chronic kidney disease: Secondary | ICD-10-CM

## 2023-05-12 DIAGNOSIS — N182 Chronic kidney disease, stage 2 (mild): Secondary | ICD-10-CM

## 2023-05-12 DIAGNOSIS — I4891 Unspecified atrial fibrillation: Secondary | ICD-10-CM

## 2023-05-12 DIAGNOSIS — S72001D Fracture of unspecified part of neck of right femur, subsequent encounter for closed fracture with routine healing: Secondary | ICD-10-CM | POA: Diagnosis not present

## 2023-05-12 NOTE — Assessment & Plan Note (Addendum)
Slight tachyarrhythmia present.  Rate control is adequate.  Continue to monitor.

## 2023-05-12 NOTE — Patient Instructions (Signed)
See assessment and plan under each diagnosis in the problem list and acutely for this visit 

## 2023-05-12 NOTE — Assessment & Plan Note (Addendum)
Patient informed of results; Nutritionist to assess at SNF.

## 2023-05-12 NOTE — Assessment & Plan Note (Addendum)
Adequate diabetic control is suggested by A1c of 7.2%.  Glucoses while hospitalized ranged from 121 up to 206, both were outliers. GFR stable at greater than 60 indicating stage II CKD. Continue glucose monitoring at SNF.

## 2023-05-12 NOTE — Assessment & Plan Note (Addendum)
PT/OT at SNF as tolerated.  Risk of muscle relaxants discussed with patient; she agrees to discontinue this medication. Surgical sutures out in 2 weeks.   Orthopedic follow-up as scheduled.

## 2023-05-12 NOTE — Assessment & Plan Note (Addendum)
TSH markedly suppressed at 0.119 and free T4 elevated to 1.31 (0.6 1-1.12).  L-thyroxine held until follow-up with PCP. Need to recheck TSH in 6-8 weeks off L-thyroxine discussed with patient.

## 2023-05-12 NOTE — Progress Notes (Signed)
NURSING HOME LOCATION:  [facility] ROOM NUMBER:  [id number]  CODE STATUS: Full Code   [id number] Dickey NP  This is a comprehensive admission note to this SNFperformed on this date less than 30 days from date of admission. Included are preadmission medical/surgical history; reconciled medication list; family history; social history and comprehensive review of systems.  Corrections and additions to the records were documented. Comprehensive physical exam was also performed. Additionally a clinical summary was entered for each active diagnosis pertinent to this admission in the Problem List to enhance continuity of care.  HPI: She was hospitalized 5/5 - 05/08/2023 for right hip hemiarthroplasty by Dr. Dallas Schimke following a mechanical fall at home. Hospital course was complicated by new onset A-fib with RVR on 5/7.  Cardiology consulted; IV diltiazem was initiated with subsequent transition to oral diltiazem.  In the context of diltiazem initiation, ARB was held.  Amiodarone was initiated along with apixaban.  TSH was markedly suppressed at 0.119 and free T4 elevated to 1.31 (0.61-1.12).  L-thyroxine was held with plan for recheck in near future. Preop H/H was 14.3/45.1; final H/H was 10.1/32.  Protein/caloric malnutrition was suggested by total protein of 5.9 and albumin 3.2.  Glucoses ranged from 121 up to 206; both were outliers.  A1c was 7.2% indicating adequate control.Jardiance and metformin were continued for type 2 diabetes. She was discharged to the [facility] for rehab.  Staple removal was to be in 2 weeks postop.  Past medical and surgical history: Includes essential hypertension, hypothyroidism, and diabetes complicated by CKD and hypertension, and chronic constipation. Prior surgeries include bilateral cataract surgery.  Social history: Non-smoker, nondrinker.  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  Family history: Noncontributory due to advanced age.    Review of systems: She is alert and oriented and very communicative. She validated that there was no neurologic or cardiac prodrome prior to the fall despite the A-fib while hospitalized.  She was aware of the A-fib and stated she has not had any dysrhythmias in the past.  She states that her pain is improving; she has not taken the muscle relaxant.  She denies any diabetic related symptoms.  She had been checking her glucoses at night; they have been averaging approximately 135.  Constitutional: No fever, significant weight change, fatigue  Eyes: No redness, discharge, pain, vision change ENT/mouth: No nasal congestion, purulent discharge, earache, change in hearing, sore throat  Cardiovascular: No chest pain, palpitations, paroxysmal nocturnal dyspnea, claudication, edema  Respiratory: No cough, sputum production, hemoptysis, DOE, significant snoring, apnea Gastrointestinal: No heartburn, dysphagia, abdominal pain, nausea /vomiting, rectal bleeding, melena, change in bowels Genitourinary: No dysuria, hematuria, pyuria, incontinence, nocturia Musculoskeletal: No joint stiffness, joint swelling, weakness, pain Dermatologic: No rash, pruritus, change in appearance of skin Neurologic: No dizziness, headache, syncope, seizures, numbness, tingling Psychiatric: No significant anxiety, depression, insomnia, anorexia Endocrine: No change in hair/skin/nails, excessive thirst, excessive hunger, excessive urination  Hematologic/lymphatic: No significant bruising, lymphadenopathy, abnormal bleeding Allergy/immunology: No itchy/watery eyes, significant sneezing, urticaria, angioedema  Physical exam:  Pertinent or positive findings: She appears younger than her stated age.  Initially she was asleep and exhibited no hypopnea, snoring, or frank apnea.  Lower lids are puffy.  The left lacrimal gland is more prominent than the right.  Dental hygiene is excellent.  She exhibits a slight tach with slight  irregularity.  She has trace edema at the sock line.  Dorsalis pedis pulses are stronger than posterior tibial  pulses.  She has DIP osteoarthritic changes.  Interosseous wasting is suggested.  Bruising is noted over the left upper extremity. General appearance: Adequately nourished; no acute distress, increased work of breathing is present.   Lymphatic: No lymphadenopathy about the head, neck, axilla. Eyes: No conjunctival inflammation or lid edema is present. There is no scleral icterus. Ears:  External ear exam shows no significant lesions or deformities.   Nose:  External nasal examination shows no deformity or inflammation. Nasal mucosa are pink and moist without lesions, exudates Oral exam: Lips and gums are healthy appearing.There is no oropharyngeal erythema or exudate. Neck:  No thyromegaly, masses, tenderness noted.    Heart:  Normal rate and regular rhythm. S1 and S2 normal without gallop, murmur, click, rub.  Lungs: Chest clear to auscultation without wheezes, rhonchi, rales, rubs. Abdomen: Bowel sounds are normal.  Abdomen is soft and nontender with no organomegaly, hernias, masses. GU: Deferred  Extremities:  No cyanosis, clubbing, edema. Neurologic exam:  Strength equal  in upper & lower extremities. Balance, Rhomberg, finger to nose testing could not be completed due to clinical state Deep tendon reflexes are equal Skin: Warm & dry w/o tenting. No significant lesions or rash.  See clinical summary under each active problem in the Problem List with associated updated therapeutic plan

## 2023-05-15 ENCOUNTER — Other Ambulatory Visit (HOSPITAL_COMMUNITY)
Admission: RE | Admit: 2023-05-15 | Discharge: 2023-05-15 | Disposition: A | Discharge status: Home / Self Care | Payer: Medicare Other | Source: Skilled Nursing Facility | Attending: Adult Health | Admitting: Adult Health

## 2023-05-15 DIAGNOSIS — D649 Anemia, unspecified: Secondary | ICD-10-CM | POA: Insufficient documentation

## 2023-05-15 LAB — CBC WITH DIFFERENTIAL/PLATELET
Abs Immature Granulocytes: 0.11 10*3/uL — ABNORMAL HIGH (ref 0.00–0.07)
Basophils Absolute: 0 10*3/uL (ref 0.0–0.1)
Basophils Relative: 1 %
Eosinophils Absolute: 0.2 10*3/uL (ref 0.0–0.5)
Eosinophils Relative: 2 %
HCT: 32.7 % — ABNORMAL LOW (ref 36.0–46.0)
Hemoglobin: 10.2 g/dL — ABNORMAL LOW (ref 12.0–15.0)
Immature Granulocytes: 2 %
Lymphocytes Relative: 24 %
Lymphs Abs: 1.6 10*3/uL (ref 0.7–4.0)
MCH: 29.5 pg (ref 26.0–34.0)
MCHC: 31.2 g/dL (ref 30.0–36.0)
MCV: 94.5 fL (ref 80.0–100.0)
Monocytes Absolute: 0.7 10*3/uL (ref 0.1–1.0)
Monocytes Relative: 11 %
Neutro Abs: 4 10*3/uL (ref 1.7–7.7)
Neutrophils Relative %: 60 %
Platelets: 343 10*3/uL (ref 150–400)
RBC: 3.46 MIL/uL — ABNORMAL LOW (ref 3.87–5.11)
RDW: 16.6 % — ABNORMAL HIGH (ref 11.5–15.5)
WBC: 6.5 10*3/uL (ref 4.0–10.5)
nRBC: 0 % (ref 0.0–0.2)

## 2023-05-20 ENCOUNTER — Encounter: Payer: Self-pay | Admitting: Orthopedic Surgery

## 2023-05-20 ENCOUNTER — Ambulatory Visit (INDEPENDENT_AMBULATORY_CARE_PROVIDER_SITE_OTHER): Payer: Medicare Other | Admitting: Orthopedic Surgery

## 2023-05-20 ENCOUNTER — Other Ambulatory Visit (INDEPENDENT_AMBULATORY_CARE_PROVIDER_SITE_OTHER): Payer: Medicare Other

## 2023-05-20 VITALS — BP 121/66 | HR 78

## 2023-05-20 DIAGNOSIS — Z9889 Other specified postprocedural states: Secondary | ICD-10-CM

## 2023-05-20 NOTE — Progress Notes (Signed)
Orthopaedic Postop Note  Assessment: Traci Koch is a 82 y.o. female s/p Right hip hemiarthroplasty  DOS: 05/04/2023  Plan: Cherlynn Polo were already removed.  Dressing as needed Continue using hip abduction pillow while in bed until 6 weeks after surgery Continue posterior hip precautions until 3 months postop Continue with DVT prophylaxis for at least 6 weeks after surgery WBAT on the operative extremity Follow up in 4 weeks; call with any issues     Follow-up: No follow-ups on file. XR at next visit: AP pelvis and Right hip  Subjective:  Chief Complaint  Patient presents with   Routine Post Op    R hip DOS 05/04/23    History of Present Illness: Traci Koch is a 82 y.o. female who presents following the above stated procedure.  Surgery was approximately 2 weeks ago.  She was discharged to the [facility] center.  She is doing well.  She is ambulating and working with physical therapy.  She has some pain in her right groin.  She is not taking medicines on a consistent basis.  Staples have already been removed.  No issues with her incision.  She has been using regular pillows between her legs at nighttime.  Review of Systems: No fevers or chills No numbness or tingling No Chest Pain No shortness of breath   Objective: BP 121/66   Pulse 78   LMP  (LMP Unknown) Comment: menopausal  Physical Exam:  Alert and oriented.  No acute distress.  Seated in a wheelchair.  Surgical incision is healing well.  No surrounding erythema or drainage.  She tolerates gentle range of motion of the right hip.  She is able to maintain a straight leg raise.  She has intact sensation to the dorsum of her foot.  Intact active dorsiflexion with good strength.  IMAGING: I personally ordered and reviewed the following images:  XR of the Right hip and AP pelvis demonstrates a press-fit hip hemiarthroplasty in good position.  The hip remains reduced.  There is no evidence of implant subsidence.  No acute  fractures are noted.  Impression: Right hip hemiarthroplasty in stable position, without evidence of migration or subsidence compared to prior XR   Oliver Barre, MD 05/20/2023 11:27 AM

## 2023-05-22 ENCOUNTER — Encounter: Payer: Self-pay | Admitting: Adult Health

## 2023-05-22 ENCOUNTER — Non-Acute Institutional Stay (SKILLED_NURSING_FACILITY): Payer: Medicare Other | Admitting: Adult Health

## 2023-05-22 DIAGNOSIS — E1122 Type 2 diabetes mellitus with diabetic chronic kidney disease: Secondary | ICD-10-CM

## 2023-05-22 DIAGNOSIS — I129 Hypertensive chronic kidney disease with stage 1 through stage 4 chronic kidney disease, or unspecified chronic kidney disease: Secondary | ICD-10-CM

## 2023-05-22 DIAGNOSIS — S72001S Fracture of unspecified part of neck of right femur, sequela: Secondary | ICD-10-CM | POA: Diagnosis not present

## 2023-05-22 DIAGNOSIS — N182 Chronic kidney disease, stage 2 (mild): Secondary | ICD-10-CM

## 2023-05-22 DIAGNOSIS — I4891 Unspecified atrial fibrillation: Secondary | ICD-10-CM | POA: Diagnosis not present

## 2023-05-22 NOTE — Progress Notes (Signed)
Cardiology Office Note   Date:  05/26/2023   ID:  Rebcca Knipfer, DOB [birth year], MRN [id number]  PCP:  Rebekah Chesterfield, NP  Cardiologist:  Dr. Wyline Mood    Chief Complaint  Patient presents with   Atrial Fibrillation      History of Present Illness: Traci Koch is a 82 y.o. female who presents for atrial fibrillation  She has a hx of DM2, HTN, hypothryoidism and was seen in hospital at [facility] for atrial fib new onset in perioperative time after fall with hip fracture.  She had Rt hip hemiarthroplasty 05/04/23 and she developed a fib with RVr.  Placed on IV heparin and dilt drip. She only felt her heart beating strongly no severe palpitations.CHA2DS2VAsc of 5,   Echo with EF 65-70% no RWMA, mild LVH-she was discharged with amio (200 BID for 7 days then once daily,), dilt -240 mg and eliquis.  She is on synthroid and TSH of 0.119 --followed by IM as outpt and labs to be checked in June. Plan was to see back and possible DCCV.   Today on review of labs free T4 done 05/07/23 was 1.31.  Hgb was 10. Plts 159 Cr 0.84   Her EKG today reveals SR --she continues of amiodarone dilt and eliquis.  She has had lower Rt leg edema.  She tries to keep her leg elevated but freq leg hangs down during the day.  Her lt leg without edema.  She is not sure how much salt she is getting.  She will monitor her intake. No chest pain. No SOB --is slowly coming along with rehab.     Past Medical History:  Diagnosis Date   Diabetes mellitus without complication (HCC)    Hypertension    Thyroid disease     Past Surgical History:  Procedure Laterality Date   HIP ARTHROPLASTY Right 05/04/2023   Procedure: ARTHROPLASTY BIPOLAR HIP (HEMIARTHROPLASTY);  Surgeon: Oliver Barre, MD;  Location: [facility];  Service: Orthopedics;  Laterality: Right;     Current Outpatient Medications  Medication Sig Dispense Refill   acetaminophen (TYLENOL) 325 MG tablet Take 2 tablets (650 mg total) by mouth every 6 (six) hours as needed for  mild pain, fever or headache (or Fever >/= 101).     amiodarone (PACERONE) 200 MG tablet Take 200 mg by mouth daily.     apixaban (ELIQUIS) 5 MG TABS tablet Take 1 tablet (5 mg total) by mouth 2 (two) times daily. 60 tablet    cholecalciferol (VITAMIN D3) 25 MCG (1000 UNIT) tablet Take 1,000 Units by mouth daily.     diltiazem (CARDIZEM CD) 240 MG 24 hr capsule Take 1 capsule (240 mg total) by mouth daily.     docusate sodium (COLACE) 100 MG capsule Take 1 capsule (100 mg total) by mouth 2 (two) times daily. 60 capsule 2   empagliflozin (JARDIANCE) 25 MG TABS tablet Take 25 mg by mouth daily.     metFORMIN (GLUCOPHAGE-XR) 500 MG 24 hr tablet Take 1 tablet (500 mg total) by mouth daily with breakfast.     methocarbamol (ROBAXIN) 500 MG tablet Take 1 tablet (500 mg total) by mouth every 8 (eight) hours as needed for muscle spasms.     Multiple Vitamins-Minerals (PRESERVISION AREDS 2) CAPS Take 1 tablet by mouth 2 (two) times daily.     No current facility-administered medications for this visit.    Allergies:   Codeine, Penicillins, and Tetanus-diphth-acell pertussis    Social History:  The patient  reports that she has never smoked. She has never used smokeless tobacco. She reports that she does not drink alcohol and does not use drugs.   Family History:  The patient's family history is not on file.    ROS:  General:no colds or fevers, no weight changes Skin:no rashes or ulcers HEENT:no blurred vision, no congestion CV:see HPI PUL:see HPI GI:no diarrhea constipation or melena, no indigestion GU:no hematuria, no dysuria MS:no joint pain, no claudication Neuro:no syncope, no lightheadedness Endo:+ diabetes, + thyroid disease  Wt Readings from Last 3 Encounters:  05/26/23 171 lb (77.6 kg)  05/22/23 177 lb (80.3 kg)  05/11/23 177 lb 14.4 oz (80.7 kg)     PHYSICAL EXAM: VS:  BP 122/64   Pulse 75   Wt 171 lb (77.6 kg)   LMP  (LMP Unknown) Comment: menopausal  SpO2 95%   BMI  30.29 kg/m  , BMI Body mass index is 30.29 kg/m. General:Pleasant affect, NAD Skin:Warm and dry, brisk capillary refill HEENT:normocephalic, sclera clear, mucus membranes moist Neck:supple, no JVD, no bruits  Heart:S1S2 RRR without murmur, gallup, rub or click Lungs:clear without rales, rhonchi, or wheezes WUJ:WJXB, non tender, + BS, do not palpate liver spleen or masses Ext:no lower ext edema, 2+ pedal pulses, 2+ radial pulses Neuro:alert and oriented, MAE, follows commands, + facial symmetry    EKG:  EKG is ordered today. The ekg ordered today demonstrates SR    Recent Labs: 05/04/2023: ALT 14 05/06/2023: TSH 0.119 05/07/2023: BUN 26; Creatinine, Ser 0.84; Magnesium 2.1; Potassium 3.7; Sodium 136 05/15/2023: Hemoglobin 10.2; Platelets 343    Lipid Panel No results found for: "CHOL", "TRIG", "HDL", "CHOLHDL", "VLDL", "LDLCALC", "LDLDIRECT"     Other studies Reviewed: Additional studies/ records that were reviewed today include: . Echocardiogram 05/05/2023:  1. Left ventricular ejection fraction, by estimation, is 65 to 70%. The  left ventricle has normal function. The left ventricle has no regional  wall motion abnormalities. There is mild left ventricular hypertrophy.  Left ventricular diastolic parameters  are indeterminate.   2. Right ventricular systolic function is normal. The right ventricular  size is normal. There is mildly elevated pulmonary artery systolic  pressure.   3. The mitral valve is normal in structure. No evidence of mitral valve  regurgitation. No evidence of mitral stenosis.   4. The aortic valve is tricuspid. Aortic valve regurgitation is not  visualized. No aortic stenosis is present.   5. The inferior vena cava is normal in size with <50% respiratory  variability, suggesting right atrial pressure of 8 mmHg.     ASSESSMENT AND PLAN:  1. Atrial fib on amiodarone dilt and eliquis.  Now in SR.  Will leave on these 3 meds for now, follow up in 3 months.  No bleeding.  ? Stop amiodarone in 3 months.  2.  Lower Ext edema Rt leg.  Recommend TED stockings apply in AM and remove PM, low salt diet.  No SOB believe from surgery on Rt leg.  Have asked staff to elevate for 3 hours during the day.   3.  HTN well controlled. Continue meds  4. Hx fx hip now in rehab s/p rt hip hemiarthroplasty 05/05/23      5.  Hypothyroidism stable.    6.  DM-2 per IM   Current medicines are reviewed with the patient today.  The patient Has no concerns regarding medicines.  The following changes have been made:  See above Labs/ tests ordered today include:see above  Disposition:  FU:  see above  Signed, Nada Boozer, NP  05/26/2023 2:57 PM    [facility] [address] [place], [place], [place]  [address]/ [address] [place], [place] Phone: [phone]; Fax: [phone]  [phone]

## 2023-05-22 NOTE — Progress Notes (Signed)
Location:  [facility] Room Number: NO/134/P Place of Service:  SNF (31) Amali Uhls S.,NP  CODE STATUS: FULL  Allergies  Allergen Reactions   Codeine Other (See Comments)    Hallucinations    Penicillins     Swelling    Tetanus-Diphth-Acell Pertussis Swelling    Chief Complaint  Patient presents with   Acute Visit    Care plan meeting     HPI:  We have come together for her care plan meeting. Family present. BIMS 15/15 mood 0/30. Uses wheelchair no falls since admission. She requires moderate assist with her adl care. Dietary: regular diet; setup for meals; weighs 177 pounds; good appetite. Therapy: transfer contact guard; bed mobility supervision; upper body: supervision; lower body: contact guard; brp: min assist; ambulate 180 feet with rolling walker contact guard; stairs: bilateral rails contact guard; car transfer: min assist. Activities: in room. SLUMS 14/30 to 21/30.  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   Closed fracture right hip sequela  Past Medical History:  Diagnosis Date   Diabetes mellitus without complication (HCC)    Hypertension    Thyroid disease     Past Surgical History:  Procedure Laterality Date   HIP ARTHROPLASTY Right 05/04/2023   Procedure: ARTHROPLASTY BIPOLAR HIP (HEMIARTHROPLASTY);  Surgeon: Oliver Barre, MD;  Location: [facility];  Service: Orthopedics;  Laterality: Right;    Social History   Socioeconomic History   Marital status: Unknown    Spouse name: Not on file   Number of children: Not on file   Years of education: Not on file   Highest education level: Not on file  Occupational History   Not on file  Tobacco Use   Smoking status: Never   Smokeless tobacco: Never  Vaping Use   Vaping Use: Never used  Substance and Sexual Activity   Alcohol use: Never   Drug  use: Never   Sexual activity: Not Currently  Other Topics Concern   Not on file  Social History Narrative   Not on file   Social Determinants of Health   Financial Resource Strain: Not on file  Food Insecurity: No Food Insecurity (05/04/2023)   Hunger Vital Sign    Worried About Running Out of Food in the Last Year: Never true    Ran Out of Food in the Last Year: Never true  Transportation Needs: No Transportation Needs (05/04/2023)   PRAPARE - Administrator, Civil Service (Medical): No    Lack of Transportation (Non-Medical): No  Physical Activity: Not on file  Stress: Not on file  Social Connections: Not on file  Intimate Partner Violence: Not At Risk (05/04/2023)   Humiliation, Afraid, Rape, and Kick questionnaire    Fear of Current or Ex-Partner: No    Emotionally Abused: No    Physically Abused: No    Sexually Abused: No   History reviewed. No pertinent family history.    VITAL SIGNS BP 138/68   Pulse 66   Temp (!) 97.3 F (36.3 C)   Resp 20   Ht 5\' 3"  (1.6 m)   Wt 177 lb (80.3 kg)   LMP  (LMP Unknown) Comment: menopausal  SpO2 94%   BMI 31.35 kg/m   Outpatient Encounter Medications as of 05/22/2023  Medication Sig Note   acetaminophen (TYLENOL) 325 MG tablet Take 2  tablets (650 mg total) by mouth every 6 (six) hours as needed for mild pain, fever or headache (or Fever >/= 101).    amiodarone (PACERONE) 200 MG tablet 1 po BID thru 5/14, then 1 po daily    apixaban (ELIQUIS) 5 MG TABS tablet Take 1 tablet (5 mg total) by mouth 2 (two) times daily.    cholecalciferol (VITAMIN D3) 25 MCG (1000 UNIT) tablet Take 1,000 Units by mouth daily.    diltiazem (CARDIZEM CD) 240 MG 24 hr capsule Take 1 capsule (240 mg total) by mouth daily.    docusate sodium (COLACE) 100 MG capsule Take 1 capsule (100 mg total) by mouth 2 (two) times daily.    empagliflozin (JARDIANCE) 25 MG TABS tablet Take 25 mg by mouth daily. 05/03/2023: Doctor gave samples   metFORMIN  (GLUCOPHAGE-XR) 500 MG 24 hr tablet Take 1 tablet (500 mg total) by mouth daily with breakfast.    Multiple Vitamins-Minerals (PRESERVISION AREDS 2) CAPS Take 1 tablet by mouth 2 (two) times daily.    methocarbamol (ROBAXIN) 500 MG tablet Take 1 tablet (500 mg total) by mouth every 8 (eight) hours as needed for muscle spasms.    No facility-administered encounter medications on file as of 05/22/2023.     SIGNIFICANT DIAGNOSTIC EXAMS  TODAY  05-03-23: right hip x-ray:  1. Acute comminuted intertrochanteric right hip fracture, with impaction and varus angulation at the fracture site.  05-03-23: chest x-ray:  1. Enlarged cardiac silhouette, with central pulmonary vascular congestion. No overt edema.  LABS REVIEWED;   05-03-23; wbc 10.8; hgb 14.3; hct 45.1; mcv 98.7 plt 226; glucose 201 bun 17; creat 0.82; k+ 3.6; na++ 139; ca 9.0; gfr >60; protein 6.6 albumin 3.8 05-05-23: wbc 9.5; hg 11.8; hct 37.6; mcv 93.3 plt 197; glucose 129; bun 19; creat 0.87; k+ 3.9; na++ 137; ca 8.3 gfr >60; hgb A1c 7.2 05-06-23: glucose 139; bun 22; creat 0.81; k+ 3.9; na++ 136; ca 8.0; gfr >60 tsh 0.119' mag 2.0 05-07-23: wbc 8.4; hgb 10.1; hct 32.0 mcv 92.8 plt 159 glucose 140; bun 26; creat 0.84; k+ 3.7; na++ 136; ca 8.3 gfr >60; mag 2.1 free t4: 1.31   Review of Systems  Constitutional:  Negative for malaise/fatigue.  Respiratory:  Negative for cough and shortness of breath.   Cardiovascular:  Negative for chest pain, palpitations and leg swelling.  Gastrointestinal:  Negative for abdominal pain, constipation and heartburn.  Musculoskeletal:  Negative for back pain, joint pain and myalgias.  Skin: Negative.   Neurological:  Negative for dizziness.  Psychiatric/Behavioral:  The patient is not nervous/anxious.     Physical Exam Constitutional:      General: She is not in acute distress.    Appearance: She is well-developed. She is not diaphoretic.  Neck:     Thyroid: No thyromegaly.  Cardiovascular:     Rate  and Rhythm: Normal rate and regular rhythm.     Pulses: Normal pulses.     Heart sounds: Murmur heard.  Pulmonary:     Effort: Pulmonary effort is normal. No respiratory distress.     Breath sounds: Normal breath sounds.  Abdominal:     General: Bowel sounds are normal. There is no distension.     Palpations: Abdomen is soft.     Tenderness: There is no abdominal tenderness.  Musculoskeletal:     Cervical back: Neck supple.     Right lower leg: No edema.     Left lower leg: No edema.  Comments:  Able to move all extremities s/p right hip fracture   Lymphadenopathy:     Cervical: No cervical adenopathy.  Skin:    General: Skin is warm and dry.  Neurological:     Mental Status: She is alert and oriented to person, place, and time.  Psychiatric:        Mood and Affect: Mood normal.       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   Time spent with patient: 40 minutes: therapy; medications; dietary.    Synthia Innocent NP [facility]   call [phone]

## 2023-05-26 ENCOUNTER — Ambulatory Visit: Payer: Medicare Other | Attending: Cardiology | Admitting: Cardiology

## 2023-05-26 ENCOUNTER — Encounter: Payer: Self-pay | Admitting: Cardiology

## 2023-05-26 VITALS — BP 122/64 | HR 75 | Wt 171.0 lb

## 2023-05-26 DIAGNOSIS — R600 Localized edema: Secondary | ICD-10-CM | POA: Diagnosis present

## 2023-05-26 DIAGNOSIS — Z7984 Long term (current) use of oral hypoglycemic drugs: Secondary | ICD-10-CM

## 2023-05-26 DIAGNOSIS — S72009S Fracture of unspecified part of neck of unspecified femur, sequela: Secondary | ICD-10-CM | POA: Diagnosis present

## 2023-05-26 DIAGNOSIS — N182 Chronic kidney disease, stage 2 (mild): Secondary | ICD-10-CM | POA: Diagnosis present

## 2023-05-26 DIAGNOSIS — Z8781 Personal history of (healed) traumatic fracture: Secondary | ICD-10-CM | POA: Diagnosis not present

## 2023-05-26 DIAGNOSIS — I129 Hypertensive chronic kidney disease with stage 1 through stage 4 chronic kidney disease, or unspecified chronic kidney disease: Secondary | ICD-10-CM | POA: Insufficient documentation

## 2023-05-26 DIAGNOSIS — I10 Essential (primary) hypertension: Secondary | ICD-10-CM | POA: Diagnosis not present

## 2023-05-26 DIAGNOSIS — I4891 Unspecified atrial fibrillation: Secondary | ICD-10-CM

## 2023-05-26 DIAGNOSIS — E039 Hypothyroidism, unspecified: Secondary | ICD-10-CM

## 2023-05-26 DIAGNOSIS — E1122 Type 2 diabetes mellitus with diabetic chronic kidney disease: Secondary | ICD-10-CM | POA: Diagnosis present

## 2023-05-26 NOTE — Patient Instructions (Signed)
Medication Instructions:  Your physician recommends that you continue on your current medications as directed. Please refer to the Current Medication list given to you today.   Labwork: None today  Testing/Procedures: None today  Follow-Up: 3 months Dr.Branch  Any Other Special Instructions Will Be Listed Below (If Applicable).  If you need a refill on your cardiac medications before your next appointment, please call your pharmacy.

## 2023-06-01 ENCOUNTER — Telehealth: Payer: Self-pay | Admitting: Orthopedic Surgery

## 2023-06-01 NOTE — Telephone Encounter (Signed)
Dr. Dallas Schimke pt - spoke w/the pt's daughter, Melina Copa [phone], she stated that the pt is at [facility] for rehab and that the pt is stating that PT told her Saturday that she'll be discharged this week.  She stated that the patient is not ready to be discharged and if she is discharged, she is requesting a script for a hospital bed.

## 2023-06-02 NOTE — Telephone Encounter (Signed)
Spoke with pt daughter who states she also spoke w Child psychotherapist who hadn't heard anything. Let pt daughter know that Dr. Dallas Schimke doesn't usually make that decision and her that they usually coordinate with the family to make sure transition is done without problems. Advised her to contact facility provider for questions or concerns.

## 2023-06-05 ENCOUNTER — Other Ambulatory Visit (HOSPITAL_COMMUNITY)
Admission: RE | Admit: 2023-06-05 | Discharge: 2023-06-05 | Disposition: A | Discharge status: Home / Self Care | Payer: Medicare Other | Source: Skilled Nursing Facility | Attending: Adult Health | Admitting: Adult Health

## 2023-06-05 DIAGNOSIS — E0590 Thyrotoxicosis, unspecified without thyrotoxic crisis or storm: Secondary | ICD-10-CM | POA: Insufficient documentation

## 2023-06-05 LAB — TSH: TSH: 1.879 u[IU]/mL (ref 0.350–4.500)

## 2023-06-05 LAB — T4, FREE: Free T4: 1.11 ng/dL (ref 0.61–1.12)

## 2023-06-15 ENCOUNTER — Other Ambulatory Visit: Payer: Self-pay | Admitting: Adult Health

## 2023-06-15 ENCOUNTER — Encounter: Payer: Self-pay | Admitting: Adult Health

## 2023-06-15 ENCOUNTER — Non-Acute Institutional Stay (SKILLED_NURSING_FACILITY): Payer: Medicare Other | Admitting: Adult Health

## 2023-06-15 DIAGNOSIS — I129 Hypertensive chronic kidney disease with stage 1 through stage 4 chronic kidney disease, or unspecified chronic kidney disease: Secondary | ICD-10-CM | POA: Diagnosis not present

## 2023-06-15 DIAGNOSIS — E1122 Type 2 diabetes mellitus with diabetic chronic kidney disease: Secondary | ICD-10-CM

## 2023-06-15 DIAGNOSIS — I4891 Unspecified atrial fibrillation: Secondary | ICD-10-CM | POA: Diagnosis not present

## 2023-06-15 DIAGNOSIS — N182 Chronic kidney disease, stage 2 (mild): Secondary | ICD-10-CM

## 2023-06-15 DIAGNOSIS — S72001S Fracture of unspecified part of neck of right femur, sequela: Secondary | ICD-10-CM | POA: Diagnosis not present

## 2023-06-15 MED ORDER — METHOCARBAMOL 500 MG PO TABS: 500.0000 mg | ORAL_TABLET | Freq: Three times a day (TID) | ORAL | 0 refills | Status: DC | PRN

## 2023-06-15 MED ORDER — METFORMIN HCL ER 500 MG PO TB24: 500.0000 mg | ORAL_TABLET | Freq: Every day | ORAL | 0 refills | Status: AC

## 2023-06-15 MED ORDER — EMPAGLIFLOZIN 25 MG PO TABS: 25.0000 mg | ORAL_TABLET | Freq: Every day | ORAL | 0 refills | Status: DC

## 2023-06-15 MED ORDER — AMIODARONE HCL 200 MG PO TABS: 200.0000 mg | ORAL_TABLET | Freq: Every day | ORAL | 0 refills | Status: DC

## 2023-06-15 MED ORDER — APIXABAN 5 MG PO TABS: 5.0000 mg | ORAL_TABLET | Freq: Two times a day (BID) | ORAL | 0 refills | Status: DC

## 2023-06-15 MED ORDER — DILTIAZEM HCL ER COATED BEADS 240 MG PO CP24: 240.0000 mg | ORAL_CAPSULE | Freq: Every day | ORAL | 0 refills | Status: DC

## 2023-06-15 NOTE — Progress Notes (Signed)
Location:  [facility] Room Number: 134 Place of Service:  SNF (31)   CODE STATUS: full   Allergies  Allergen Reactions   Codeine Other (See Comments)    Hallucinations    Penicillins     Swelling    Tetanus-Diphth-Acell Pertussis Swelling    Chief Complaint  Patient presents with   Discharge Note    HPI:  She is being discharged to home with home health for pt. She will need a rolling walker and a bsc. She will need her prescriptions written and will need to follow up with her medical provider. She had been hospitalized for right hip fracture and new onset atrial fibrillation. She was admitted to this facility for short term rehab. Therapy: upper body: mod I; lower body: mod I; bed mobility min assist; transfers mod I; stand/pivot: mod I; steps: bilateral rails and supervision. Ambulating 200 feet with rolling walker mod I.    Past Medical History:  Diagnosis Date   Diabetes mellitus without complication (HCC)    Hypertension    Thyroid disease     Past Surgical History:  Procedure Laterality Date   HIP ARTHROPLASTY Right 05/04/2023   Procedure: ARTHROPLASTY BIPOLAR HIP (HEMIARTHROPLASTY);  Surgeon: Oliver Barre, MD;  Location: [facility];  Service: Orthopedics;  Laterality: Right;    Social History   Socioeconomic History   Marital status: Unknown    Spouse name: Not on file   Number of children: Not on file   Years of education: Not on file   Highest education level: Not on file  Occupational History   Not on file  Tobacco Use   Smoking status: Never   Smokeless tobacco: Never  Vaping Use   Vaping Use: Never used  Substance and Sexual Activity   Alcohol use: Never   Drug use: Never   Sexual activity: Not Currently  Other Topics Concern   Not on file  Social History Narrative   Not on file   Social Determinants of Health   Financial Resource Strain: Not on file  Food Insecurity: No Food Insecurity (05/04/2023)   Hunger Vital Sign     Worried About Running Out of Food in the Last Year: Never true    Ran Out of Food in the Last Year: Never true  Transportation Needs: No Transportation Needs (05/04/2023)   PRAPARE - Administrator, Civil Service (Medical): No    Lack of Transportation (Non-Medical): No  Physical Activity: Not on file  Stress: Not on file  Social Connections: Not on file  Intimate Partner Violence: Not At Risk (05/04/2023)   Humiliation, Afraid, Rape, and Kick questionnaire    Fear of Current or Ex-Partner: No    Emotionally Abused: No    Physically Abused: No    Sexually Abused: No   No family history on file.    VITAL SIGNS BP 125/72   Pulse 69   Temp 98.1 F (36.7 C)   Resp 20   Ht 5\' 3"  (1.6 m)   Wt 169 lb 12.8 oz (77 kg)   LMP  (LMP Unknown) Comment: menopausal  SpO2 97%   BMI 30.08 kg/m   Outpatient Encounter Medications as of 06/15/2023  Medication Sig   acetaminophen (TYLENOL) 325 MG tablet Take 2 tablets (650 mg total) by mouth every 6 (six) hours as needed for mild pain, fever or headache (or Fever >/= 101).   amiodarone (PACERONE) 200 MG tablet Take 200 mg by mouth  daily.   apixaban (ELIQUIS) 5 MG TABS tablet Take 1 tablet (5 mg total) by mouth 2 (two) times daily.   cholecalciferol (VITAMIN D3) 25 MCG (1000 UNIT) tablet Take 1,000 Units by mouth daily.   diltiazem (CARDIZEM CD) 240 MG 24 hr capsule Take 1 capsule (240 mg total) by mouth daily.   empagliflozin (JARDIANCE) 25 MG TABS tablet Take 25 mg by mouth daily.   metFORMIN (GLUCOPHAGE-XR) 500 MG 24 hr tablet Take 1 tablet (500 mg total) by mouth daily with breakfast.   methocarbamol (ROBAXIN) 500 MG tablet Take 1 tablet (500 mg total) by mouth every 8 (eight) hours as needed for muscle spasms.   Multiple Vitamins-Minerals (PRESERVISION AREDS 2) CAPS Take 1 tablet by mouth 2 (two) times daily.   sennosides-docusate sodium (SENOKOT-S) 8.6-50 MG tablet Take 1 tablet by mouth in the morning and at bedtime.    [DISCONTINUED] docusate sodium (COLACE) 100 MG capsule Take 1 capsule (100 mg total) by mouth 2 (two) times daily.   No facility-administered encounter medications on file as of 06/15/2023.     SIGNIFICANT DIAGNOSTIC EXAMS   TODAY  05-03-23: right hip x-ray:  1. Acute comminuted intertrochanteric right hip fracture, with impaction and varus angulation at the fracture site.  05-03-23: chest x-ray:  1. Enlarged cardiac silhouette, with central pulmonary vascular congestion. No overt edema.  LABS REVIEWED;   05-03-23; wbc 10.8; hgb 14.3; hct 45.1; mcv 98.7 plt 226; glucose 201 bun 17; creat 0.82; k+ 3.6; na++ 139; ca 9.0; gfr >60; protein 6.6 albumin 3.8 05-05-23: wbc 9.5; hg 11.8; hct 37.6; mcv 93.3 plt 197; glucose 129; bun 19; creat 0.87; k+ 3.9; na++ 137; ca 8.3 gfr >60; hgb A1c 7.2 05-06-23: glucose 139; bun 22; creat 0.81; k+ 3.9; na++ 136; ca 8.0; gfr >60 tsh 0.119' mag 2.0 05-07-23: wbc 8.4; hgb 10.1; hct 32.0 mcv 92.8 plt 159 glucose 140; bun 26; creat 0.84; k+ 3.7; na++ 136; ca 8.3 gfr >60; mag 2.1 free t4: 1.31   Review of Systems  Constitutional:  Negative for malaise/fatigue.  Respiratory:  Negative for cough and shortness of breath.   Cardiovascular:  Negative for chest pain, palpitations and leg swelling.  Gastrointestinal:  Negative for abdominal pain, constipation and heartburn.  Musculoskeletal:  Negative for back pain, joint pain and myalgias.  Skin: Negative.   Neurological:  Negative for dizziness.  Psychiatric/Behavioral:  The patient is not nervous/anxious.     Physical Exam Constitutional:      General: She is not in acute distress.    Appearance: She is well-developed. She is not diaphoretic.  Neck:     Thyroid: No thyromegaly.  Cardiovascular:     Rate and Rhythm: Normal rate and regular rhythm.     Pulses: Normal pulses.     Heart sounds: Murmur heard.  Pulmonary:     Effort: Pulmonary effort is normal. No respiratory distress.     Breath sounds: Normal  breath sounds.  Abdominal:     General: Bowel sounds are normal. There is no distension.     Palpations: Abdomen is soft.     Tenderness: There is no abdominal tenderness.  Musculoskeletal:     Cervical back: Neck supple.     Right lower leg: No edema.     Left lower leg: No edema.     Comments:  Able to move all extremities s/p right hip fracture    Lymphadenopathy:     Cervical: No cervical adenopathy.  Skin:  General: Skin is warm and dry.  Neurological:     Mental Status: She is alert and oriented to person, place, and time.  Psychiatric:        Mood and Affect: Mood normal.      ASSESSMENT/ PLAN:   Patient is being discharged with the following home health services:  pt: to evaluate and treat as indicated for gait balance strength.   Patient is being discharged with the following durable medical equipment:  rolling walker and bedside commode  Patient has been advised to f/u with their PCP in 1-2 weeks to for a transitions of care visit.  Social services at their facility was responsible for arranging this appointment.  Pt was provided with adequate prescriptions of noncontrolled medications to reach the scheduled appointment .  For controlled substances, a limited supply was provided as appropriate for the individual patient.  If the pt normally receives these medications from a pain clinic or has a contract with another physician, these medications should be received from that clinic or physician only).    A 30 day supply of prescription medications have been sent to [facility] in [place]  Time spent with patient 35 minutes: medications; dme; home health    Synthia Innocent NP [facility]  call [phone]

## 2023-06-16 ENCOUNTER — Other Ambulatory Visit: Payer: Self-pay | Admitting: Adult Health

## 2023-06-17 ENCOUNTER — Encounter: Payer: Medicare Other | Admitting: Orthopedic Surgery

## 2023-06-23 ENCOUNTER — Ambulatory Visit (INDEPENDENT_AMBULATORY_CARE_PROVIDER_SITE_OTHER): Payer: Medicare Other | Admitting: Orthopedic Surgery

## 2023-06-23 ENCOUNTER — Encounter: Payer: Self-pay | Admitting: Orthopedic Surgery

## 2023-06-23 ENCOUNTER — Telehealth: Payer: Self-pay | Admitting: Cardiology

## 2023-06-23 ENCOUNTER — Other Ambulatory Visit (INDEPENDENT_AMBULATORY_CARE_PROVIDER_SITE_OTHER): Payer: Medicare Other

## 2023-06-23 VITALS — BP 137/75 | HR 78 | Ht 63.0 in | Wt 169.0 lb

## 2023-06-23 DIAGNOSIS — S72001D Fracture of unspecified part of neck of right femur, subsequent encounter for closed fracture with routine healing: Secondary | ICD-10-CM | POA: Diagnosis not present

## 2023-06-23 NOTE — Telephone Encounter (Signed)
Pt c/o medication issue:  1. Name of Medication: apixaban (ELIQUIS) 5 MG TABS tablet  2. How are you currently taking this medication (dosage and times per day)? Take 1 tablet (5 mg total) by mouth 2 (two) times daily.  3. Are you having a reaction (difficulty breathing--STAT)? No   4. What is your medication issue? Pt's daughter would like to let provider know that the medication above is too expensive for out of pocket costs and is looking for alternatives or patient assistance. She needs a 90 days supply to last her till August. Please advise.

## 2023-06-23 NOTE — Patient Instructions (Signed)
Ok to transition to a cane.  Recommend you do this slowly, with the assistance of physical therapy.

## 2023-06-23 NOTE — Progress Notes (Signed)
Orthopaedic Postop Note  Assessment: Traci Koch is a 82 y.o. female s/p Right hip hemiarthroplasty  DOS: 05/04/2023  Plan: Traci Koch is recovering appropriately.  Radiographs stable.  No evidence of subsidence.  Occasional pain in her groin, which I anticipate will continue to improve.  Okay to transition to a cane, and we provided her with a prescription for a 4-prong cane.  Continue to follow posterior hip precautions.  Eliquis per the cardiologist.  Follow-up in 6 weeks.   Follow-up: Return in about 6 weeks (around 08/04/2023). XR at next visit: AP pelvis and Right hip  Subjective:  Chief Complaint  Patient presents with   Post-op Follow-up    Hip surgery 05/04/23     History of Present Illness: Traci Koch is a 82 y.o. female who presents following the above stated procedure.  Surgery was approximately 7 weeks ago.  She was discharged from the [facility] center last week.  She is now working with home health at home.  She takes Tylenol a couple days per week.  Otherwise, she is doing well from a pain perspective.  Occasional pain in her right groin.  This is improving.  She is ambulating well with the assistance of walker.  No issues otherwise.   Review of Systems: No fevers or chills No numbness or tingling No Chest Pain No shortness of breath   Objective: BP 137/75   Pulse 78   Ht 5\' 3"  (1.6 m)   Wt 169 lb (76.7 kg)   LMP  (LMP Unknown) Comment: menopausal  BMI 29.94 kg/m   Physical Exam:  Alert and oriented.  No acute distress. Steady gait using a walker  Right lateral hip incision is healing.  No surrounding erythema or drainage.  No tenderness to palpation.  She is able to maintain a straight leg raise.  She tolerates gentle range of motion of her hip.  She does have some diffuse swelling in the bilateral lower extremities  IMAGING: I personally ordered and reviewed the following images:  AP pelvis and right hip x-rays were obtained in clinic today.  Right hip  hemiarthroplasty remains in good position.  No evidence of subsidence.  No new fractures.  Hip joint is reduced.  No bony lesions.  Impression: Right hip hemiarthroplasty without evidence of subsidence.  Traci Barre, MD 06/23/2023 1:40 PM

## 2023-06-24 NOTE — Telephone Encounter (Signed)
Pavero, Cristal Deer, RPH  Nori Riis, RN Patient does not appear to have a Medicare part D plan which will make Eliquis expensive for her. She may very well apply for patient assistance however.  Application is found at [email or web address]

## 2023-06-24 NOTE — Telephone Encounter (Signed)
Pt has meidcare A and B and [employer]. I have asked PharmD to take a look.

## 2023-06-25 MED ORDER — APIXABAN 5 MG PO TABS: 5.0000 mg | ORAL_TABLET | Freq: Two times a day (BID) | ORAL | 0 refills | Status: DC

## 2023-06-25 NOTE — Telephone Encounter (Signed)
Spoke to patients daughter who stated she will come by the [place] office to pick up patient assistance forms for BMS for Eliquis.   Eliquis Samples: Lot #: [id number] Exp: 05/2024

## 2023-06-25 NOTE — Telephone Encounter (Signed)
Daughter returned CMA's call. 

## 2023-06-25 NOTE — Telephone Encounter (Signed)
Left a message for patient to call office back regarding patient assistance.

## 2023-07-13 ENCOUNTER — Other Ambulatory Visit: Payer: Self-pay | Admitting: Adult Health

## 2023-08-04 ENCOUNTER — Encounter: Payer: Self-pay | Admitting: Orthopedic Surgery

## 2023-08-04 ENCOUNTER — Ambulatory Visit: Payer: Medicare Other | Admitting: Orthopedic Surgery

## 2023-08-04 ENCOUNTER — Other Ambulatory Visit (INDEPENDENT_AMBULATORY_CARE_PROVIDER_SITE_OTHER): Payer: Medicare Other

## 2023-08-04 VITALS — BP 151/75 | HR 81 | Ht 63.0 in | Wt 169.0 lb

## 2023-08-04 DIAGNOSIS — S72001D Fracture of unspecified part of neck of right femur, subsequent encounter for closed fracture with routine healing: Secondary | ICD-10-CM

## 2023-08-04 NOTE — Progress Notes (Signed)
Orthopaedic Postop Note  Assessment: Traci Koch is a 82 y.o. female s/p Right hip hemiarthroplasty  DOS: 05/04/2023  Plan: Traci Koch is doing well.  She is now walking with a 4 pronged cane.  Occasional pain in the groin, but this is not that often.  Stressed the importance of walking to improve strength.  Continue with Tylenol as needed.  Regarding her right knee, if this continues to be a problem, have asked her to schedule an earlier appointment.  Otherwise I will see her back in 3 months.   Follow-up: Return in about 3 months (around 11/04/2023). XR at next visit: AP pelvis and Right hip  Subjective:  Chief Complaint  Patient presents with   Post-op Follow-up    05/04/23 right hip hemiarthroplasty     History of Present Illness: Traci Koch is a 82 y.o. female who presents following the above stated procedure.  Surgery was approximately 3 months ago.  She is doing well.  She continues to work with physical therapy at home.  She is working on some range of motion activities.  She is now using 4-prong cane.  Occasional pain in the groin.  She uses Tylenol once a week, approximately.  Review of Systems: No fevers or chills No numbness or tingling No Chest Pain No shortness of breath   Objective: BP (!) 151/75   Pulse 81   Ht 5\' 3"  (1.6 m)   Wt 169 lb (76.7 kg)   LMP  (LMP Unknown) Comment: menopausal  BMI 29.94 kg/m   Physical Exam:  Alert and oriented.  No acute distress. Steady gait using a cane  Right lateral hip incision is healed.  No surrounding erythema or drainage.  No tenderness to palpation.  She can maintain a straight leg raise.  Mild discomfort in the groin with range of motion.  No pain with axial loading.  IMAGING: I personally ordered and reviewed the following images:  AP pelvis and right hip x-rays were obtained in clinic today.  Right hip hemiarthroplasty in unchanged position.  Hip is reduced.  No subsidence.  No periprosthetic lucency.  No new  injuries.  No bony lesions.  Impression: Right hip hemiarthroplasty in stable position.  Oliver Barre, MD 08/04/2023 2:02 PM

## 2023-08-04 NOTE — Patient Instructions (Signed)
If your knee continues to bother you, please contact the office for an earlier appointment

## 2023-08-26 ENCOUNTER — Encounter: Payer: Self-pay | Admitting: *Deleted

## 2023-08-26 ENCOUNTER — Ambulatory Visit: Payer: Medicare Other | Attending: Student | Admitting: Student

## 2023-08-26 ENCOUNTER — Encounter: Payer: Self-pay | Admitting: Student

## 2023-08-26 VITALS — BP 132/74 | HR 67 | Ht 62.0 in | Wt 167.4 lb

## 2023-08-26 DIAGNOSIS — I4891 Unspecified atrial fibrillation: Secondary | ICD-10-CM | POA: Diagnosis present

## 2023-08-26 DIAGNOSIS — I9789 Other postprocedural complications and disorders of the circulatory system, not elsewhere classified: Secondary | ICD-10-CM | POA: Insufficient documentation

## 2023-08-26 DIAGNOSIS — I10 Essential (primary) hypertension: Secondary | ICD-10-CM | POA: Diagnosis present

## 2023-08-26 DIAGNOSIS — R600 Localized edema: Secondary | ICD-10-CM | POA: Diagnosis present

## 2023-08-26 NOTE — Patient Instructions (Signed)
Medication Instructions:  Your physician has recommended you make the following change in your medication:   Stop Taking Amiodarone   *If you need a refill on your cardiac medications before your next appointment, please call your pharmacy*   Lab Work: NONE   If you have labs (blood work) drawn today and your tests are completely normal, you will receive your results only by: MyChart Message (if you have MyChart) OR A paper copy in the mail If you have any lab test that is abnormal or we need to change your treatment, we will call you to review the results.   Testing/Procedures: Your physician has recommended that you wear an event monitor. Event monitors are medical devices that record the heart's electrical activity. Doctors most often [place] these monitors to diagnose arrhythmias. Arrhythmias are problems with the speed or rhythm of the heartbeat. The monitor is a small, portable device. You can wear one while you do your normal daily activities. This is usually used to diagnose what is causing palpitations/syncope (passing out).    Follow-Up: At [facility], you and your health needs are our priority.  As part of our continuing mission to provide you with exceptional heart care, we have created designated Provider Care Teams.  These Care Teams include your primary Cardiologist (physician) and Advanced Practice Providers (APPs -  Physician Assistants and Nurse Practitioners) who all work together to provide you with the care you need, when you need it.  We recommend signing up for the patient portal called "MyChart".  Sign up information is provided on this After Visit Summary.  MyChart is used to connect with patients for Virtual Visits (Telemedicine).  Patients are able to view lab/test results, encounter notes, upcoming appointments, etc.  Non-urgent messages can be sent to your provider as well.   To learn more about what you can do with MyChart, go to [email or web address].     Your next appointment:   6 month(s)  Provider:   You may see Dina Rich, MD or one of the following Advanced Practice Providers on your designated Care Team:   Randall An, PA-C  Jacolyn Reedy, [place]    Other Instructions Thank you for choosing [facility]!

## 2023-08-26 NOTE — Progress Notes (Signed)
Cardiology Office Note    Date:  08/26/2023  ID:  Traci Koch, Traci Koch June 01, 1941, MRN [id number] Cardiologist: Dina Rich, MD    History of Present Illness:    Traci Koch is a 82 y.o. female with past medical history of HTN, Type II DM, hypothyroidism and postoperative atrial fibrillation (occurring in 04/2023) who presents to the office today for 30-month follow-up.  She was examined by Nada Boozer, NP in 04/2023 for hospital follow-up for recent postoperative atrial fibrillation with RVR. She had been discharged on Amiodarone and was in normal sinus rhythm at the time of her office visit. Was recommend to consider stopping Amiodarone in 3 months and she was continued on Eliquis 5 mg twice daily and Cardizem CD 240 mg daily.  In talking with the patient and her daughter today, she reports overall doing well since her last office visit. She denies any recent chest pain or dyspnea on exertion. No specific orthopnea or PND. Does experience occasional lower extremity edema. She also reports intermittent palpitations which occur for a few seconds at a time and spontaneously resolve. Nothing resembling her prior atrial fibrillation. She remains on Eliquis for anticoagulation and denies any evidence of active bleeding. She is concerned about the cost of Eliquis. She is also concerned about medication side effects as she reports hair loss and constipation since being started on Amiodarone.  Studies Reviewed:   EKG: EKG is not ordered today. EKG from 05/27/2023 is reviewed and shows NSR, HR 75 with PAC's. No acute ST changes.   Echocardiogram: 04/2023 IMPRESSIONS     1. Left ventricular ejection fraction, by estimation, is 65 to 70%. The  left ventricle has normal function. The left ventricle has no regional  wall motion abnormalities. There is mild left ventricular hypertrophy.  Left ventricular diastolic parameters  are indeterminate.   2. Right ventricular systolic function is normal. The  right ventricular  size is normal. There is mildly elevated pulmonary artery systolic  pressure.   3. The mitral valve is normal in structure. No evidence of mitral valve  regurgitation. No evidence of mitral stenosis.   4. The aortic valve is tricuspid. Aortic valve regurgitation is not  visualized. No aortic stenosis is present.   5. The inferior vena cava is normal in size with <50% respiratory  variability, suggesting right atrial pressure of 8 mmHg.   Risk Assessment/Calculations:    CHA2DS2-VASc Score = 4   This indicates a 4.8% annual risk of stroke. The patient's score is based upon: CHF History: 0 HTN History: 1 Diabetes History: 0 Stroke History: 0 Vascular Disease History: 0 Age Score: 2 Gender Score: 1     Physical Exam:   VS:  BP 132/74   Pulse 67   Ht 5\' 2"  (1.575 m)   Wt 167 lb 6.4 oz (75.9 kg)   LMP  (LMP Unknown) Comment: menopausal  SpO2 97%   BMI 30.62 kg/m    Wt Readings from Last 3 Encounters:  08/26/23 167 lb 6.4 oz (75.9 kg)  08/04/23 169 lb (76.7 kg)  06/23/23 169 lb (76.7 kg)     GEN: Pleasant, elderly female appearing in no acute distress NECK: No JVD; No carotid bruits CARDIAC: RRR, no murmurs, rubs, gallops RESPIRATORY:  Clear to auscultation without rales, wheezing or rhonchi  ABDOMEN: Appears non-distended. No obvious abdominal masses. EXTREMITIES: No clubbing or cyanosis. Trace lower extremity edema.  Distal pedal pulses are 2+ bilaterally.   Assessment and Plan:   1. Post-Operative Atrial Fibrillation/Use  of Long-term Anticoagulation - She remains in normal sinus rhythm by examination today. Given that she is several months out from her episode, will plan to stop Amiodarone 200 mg daily. It was previously recommended during her hospitalization that if she did not have recurrence by an outpatient monitor, anticoagulation could possibly be discontinued. Reviewed this with the patient and she will wear a 30-day monitor. If no  recurrence, would plan to stop Eliquis. We reviewed that if she does have recurrent atrial fibrillation, long-term anticoagulation would be indicated. Continue Cardizem CD 240 mg daily and Eliquis 5 mg twice daily for anticoagulation. She did have recent labs with her PCP and we will request a copy of these.  2. HTN - BP was initially elevated at 154/70, rechecked and improved 132/74. She reports BP has been checked by PT at her home and has been well-controlled. For now, continue current medical therapy with Cardizem CD 240 mg daily.  3. Lower Extremity Edema - She does report adding salt to food and we reviewed that symptoms would likely improve with reducing her sodium intake. Recommended compression stockings if this does not help. I encouraged her to make [place] aware if she has persistent symptoms as she may benefit from PRN Lasix.   Signed, Ellsworth Lennox, PA-C

## 2023-09-02 DIAGNOSIS — I4891 Unspecified atrial fibrillation: Secondary | ICD-10-CM

## 2023-09-03 ENCOUNTER — Ambulatory Visit: Payer: Medicare Other | Attending: Cardiology

## 2023-09-03 ENCOUNTER — Other Ambulatory Visit: Payer: Self-pay

## 2023-09-03 DIAGNOSIS — I4891 Unspecified atrial fibrillation: Secondary | ICD-10-CM

## 2023-09-10 ENCOUNTER — Telehealth: Payer: Self-pay | Admitting: Student

## 2023-09-10 NOTE — Telephone Encounter (Signed)
Left a message for patient to call office back regarding monitor.

## 2023-09-10 NOTE — Telephone Encounter (Signed)
Pt is requesting a callback to see how long she's suppose to wear the event monitor. She placed it on Sept 4th. Please advise

## 2023-09-10 NOTE — Telephone Encounter (Signed)
Spoke to pts daughter who is questioning if monitor was placed for 21 or 30 days. Pt is stating 30 days, daughter said 21 days. Please advise.

## 2023-09-11 NOTE — Telephone Encounter (Signed)
Left a message for patient to call office back regarding monitor.

## 2023-09-11 NOTE — Telephone Encounter (Signed)
Patients daughter (DPR) notified and verbalized understanding.

## 2023-09-15 ENCOUNTER — Other Ambulatory Visit (HOSPITAL_COMMUNITY): Payer: Self-pay | Admitting: Internal Medicine

## 2023-09-15 DIAGNOSIS — Z1231 Encounter for screening mammogram for malignant neoplasm of breast: Secondary | ICD-10-CM

## 2023-09-17 ENCOUNTER — Telehealth: Payer: Self-pay | Admitting: Cardiology

## 2023-09-17 NOTE — Telephone Encounter (Signed)
Daughter Misty Stanley notified that pt is to wear monitor for 30 Days.

## 2023-09-17 NOTE — Telephone Encounter (Signed)
Pt's daughter was wondering how long the pt needed to wear the monitor. Please advise

## 2023-10-26 ENCOUNTER — Ambulatory Visit (HOSPITAL_COMMUNITY)
Admission: RE | Admit: 2023-10-26 | Discharge: 2023-10-26 | Disposition: A | Discharge status: Home / Self Care | Payer: Medicare Other | Source: Ambulatory Visit | Attending: Internal Medicine | Admitting: Internal Medicine

## 2023-10-26 DIAGNOSIS — Z1231 Encounter for screening mammogram for malignant neoplasm of breast: Secondary | ICD-10-CM | POA: Insufficient documentation

## 2023-11-02 ENCOUNTER — Encounter (HOSPITAL_COMMUNITY): Payer: Self-pay | Admitting: Internal Medicine

## 2023-11-03 ENCOUNTER — Other Ambulatory Visit (HOSPITAL_COMMUNITY): Payer: Self-pay | Admitting: Internal Medicine

## 2023-11-03 DIAGNOSIS — R928 Other abnormal and inconclusive findings on diagnostic imaging of breast: Secondary | ICD-10-CM

## 2023-11-04 ENCOUNTER — Ambulatory Visit: Payer: Medicare Other | Admitting: Orthopedic Surgery

## 2023-11-04 ENCOUNTER — Encounter: Payer: Self-pay | Admitting: Orthopedic Surgery

## 2023-11-04 ENCOUNTER — Other Ambulatory Visit (INDEPENDENT_AMBULATORY_CARE_PROVIDER_SITE_OTHER): Payer: Medicare Other

## 2023-11-04 DIAGNOSIS — S72001D Fracture of unspecified part of neck of right femur, subsequent encounter for closed fracture with routine healing: Secondary | ICD-10-CM

## 2023-11-04 NOTE — Patient Instructions (Signed)
OK to schedule an appointment for evaluation of your knees

## 2023-11-04 NOTE — Progress Notes (Signed)
Orthopaedic Postop Note  Assessment: Traci Koch is a 82 y.o. female s/p Right hip hemiarthroplasty  DOS: 05/04/2023  Plan: Mrs. Salih is doing very well.  She has no pain in the right hip.  She is able to walk well with a cane.  She is pleased with her progress.  No pain in the groin.  No issues with her incision.  She does have pain in the right knee, and occasional pain in the left knee, and would like to be evaluated for this.  She will schedule an appointment that works for her.  Otherwise, she does not need to follow-up for her right hip.  If she has any issues, she will contact the clinic.  Follow-up: Return if symptoms worsen or fail to improve. XR at next visit: AP pelvis and Right hip  Subjective:  Chief Complaint  Patient presents with   Post-op Follow-up    Right hip fracture improving/ walking with cane     History of Present Illness: Traci Koch is a 82 y.o. female who presents following the above stated procedure.  Surgery was approximately 6 months ago.  She has done well in her recovery.  She is walking with a cane.  She has no pain in her groin.  No issues with her incision.  She is pleased with her recovery.  She does have some pain in the right knee, and occasional pain in the left knee.  She has been reluctant to walk without her cane, for fear of causing problems with her right hip.   Review of Systems: No fevers or chills No numbness or tingling No Chest Pain No shortness of breath   Objective: LMP  (LMP Unknown) Comment: menopausal  Physical Exam:  Alert and oriented.  No acute distress. Steady gait using a cane  Right lateral hip incision is healed.  No surrounding erythema or drainage.  No tenderness to palpation.  She can maintain a straight leg raise.  She tolerates gentle range of motion of the right hip without discomfort.  No pain with axial loading.  IMAGING: I personally ordered and reviewed the following images:  AP pelvis and right hip  x-rays were obtained in clinic today.  These are compared to prior x-rays.  There is some atrial formation within the inferior aspect of the hip.  No evidence of a fracture.  No periprosthetic lucency.  No subsidence.  No change in overall alignment.  Hip joint remains reduced.  Impression: Right hip hemiarthroplasty in stable position, without subsidence of the implants   Oliver Barre, MD 11/04/2023 2:02 PM

## 2023-11-17 ENCOUNTER — Encounter: Payer: Self-pay | Admitting: Orthopedic Surgery

## 2023-11-17 ENCOUNTER — Ambulatory Visit (INDEPENDENT_AMBULATORY_CARE_PROVIDER_SITE_OTHER): Payer: Medicare Other | Admitting: Orthopedic Surgery

## 2023-11-17 ENCOUNTER — Other Ambulatory Visit: Payer: Self-pay

## 2023-11-17 ENCOUNTER — Other Ambulatory Visit (INDEPENDENT_AMBULATORY_CARE_PROVIDER_SITE_OTHER): Payer: Self-pay

## 2023-11-17 VITALS — BP 167/83 | HR 61 | Ht 62.0 in | Wt 175.0 lb

## 2023-11-17 DIAGNOSIS — G8929 Other chronic pain: Secondary | ICD-10-CM

## 2023-11-17 DIAGNOSIS — M170 Bilateral primary osteoarthritis of knee: Secondary | ICD-10-CM

## 2023-11-17 NOTE — Patient Instructions (Signed)

## 2023-11-18 ENCOUNTER — Encounter: Payer: Self-pay | Admitting: Orthopedic Surgery

## 2023-11-18 NOTE — Progress Notes (Signed)
Orthopaedic Clinic Return  Assessment: Traci Koch is a 82 y.o. female with the following: Bilateral knee arthritis   Plan: Traci Koch has pain in both of her knees.  Right is worse than left.  Radiographs demonstrate advanced degenerative changes, with the right being worse than the left.  Specifically, on the right, she has a valgus alignment.  She has previously had injections, and would like to proceed with repeat injections.  These were completed clinic today without issues.  She will follow-up as needed.   Procedure note injection Right knee joint   Verbal consent was obtained to inject the right knee joint  Timeout was completed to confirm the site of injection.  The skin was prepped with alcohol and ethyl chloride was sprayed at the injection site.  A 21-gauge needle was used to inject 40 mg of Depo-Medrol and 1% lidocaine (4 cc) into the right knee using an anterolateral approach.  There were no complications. A sterile bandage was applied.   Procedure note injection Left knee joint   Verbal consent was obtained to inject the left knee joint  Timeout was completed to confirm the site of injection.  The skin was prepped with alcohol and ethyl chloride was sprayed at the injection site.  A 21-gauge needle was used to inject 40 mg of Depo-Medrol and 1% lidocaine (4 cc) into the left knee using an anterolateral approach.  There were no complications. A sterile bandage was applied.     Follow-up: Return if symptoms worsen or fail to improve.   Subjective:  Chief Complaint  Patient presents with   Knee Pain    Bilat knee pain for yrs R > L. Pt states pain has increased since she had a hip fx back in May '24.   NDC: [address]-2    History of Present Illness: Traci Koch is a 82 y.o. female who returns to clinic for evaluation of bilateral knee pain.  Right is worse than the left.  She is well-known to my clinic, having been treated for a hip fracture.  Since his stay in  the a fracture, she states that her pain has progressively worsened.  She has had injections in the past.  These have been effective.  Medications have not been effective.  She is using a cane.  Pain is worse in the lateral aspect of the right knee.  She is complaining of pain in the medial aspect of the left knee.  Review of Systems: No fevers or chills No numbness or tingling No chest pain No shortness of breath No bowel or bladder dysfunction No GI distress No headaches   Objective: BP (!) 167/83   Pulse 61   Ht 5\' 2"  (1.575 m)   Wt 175 lb (79.4 kg)   LMP  (LMP Unknown) Comment: menopausal  BMI 32.01 kg/m   Physical Exam:  Alert and oriented.  No acute distress.  Ambulates with the assistance of a cane.  Right knee with valgus alignment.  Tenderness palpation over lateral joint line.  She is able to achieve full extension.  Tolerates flexion beyond 90 degrees.  No increased laxity varus or valgus stress.  Negative Lachman.  Evaluation of the left knee demonstrates neutral clinical alignment.  No effusion.  Tenderness palpation of the medial joint line.  She is able to achieve full extension.  She tolerates flexion beyond 100 degrees.  No laxity varus valgus stress.  Negative Lachman.  IMAGING: I personally ordered and reviewed the following images:  X-rays  of bilateral knees were obtained in clinic today.  Valgus alignment of the right knee, with complete loss of joint space in the lateral compartment.  There is subchondral sclerosis, and associated cysts.  Diffuse osteophytes.  There are osteophytes within the patellofemoral joint as well.  Left knee with decreased joint space within the medial compartment.  There are large osteophytes in the lateral compartment.  Osteophytes throughout the patellofemoral compartment.  Impression: Right knee x-rays with severe degenerative changes, most prominent within the lateral compartment, left knee with moderate to severe degenerative  changes   Oliver Barre, MD 11/18/2023 12:51 PM

## 2023-12-01 ENCOUNTER — Ambulatory Visit (HOSPITAL_COMMUNITY)
Admission: RE | Admit: 2023-12-01 | Discharge: 2023-12-01 | Disposition: A | Discharge status: Home / Self Care | Payer: Medicare Other | Source: Ambulatory Visit | Attending: Internal Medicine | Admitting: Internal Medicine

## 2023-12-01 ENCOUNTER — Encounter (HOSPITAL_COMMUNITY): Payer: Self-pay

## 2023-12-01 DIAGNOSIS — R928 Other abnormal and inconclusive findings on diagnostic imaging of breast: Secondary | ICD-10-CM

## 2024-02-29 ENCOUNTER — Ambulatory Visit: Payer: Medicare Other | Attending: Cardiology | Admitting: Cardiology

## 2024-02-29 ENCOUNTER — Encounter: Payer: Self-pay | Admitting: Cardiology

## 2024-02-29 VITALS — BP 180/85 | HR 72 | Ht 62.0 in | Wt 176.6 lb

## 2024-02-29 DIAGNOSIS — I10 Essential (primary) hypertension: Secondary | ICD-10-CM

## 2024-02-29 DIAGNOSIS — I4891 Unspecified atrial fibrillation: Secondary | ICD-10-CM

## 2024-02-29 MED ORDER — AMLODIPINE BESYLATE 5 MG PO TABS: 5.0000 mg | ORAL_TABLET | Freq: Every day | ORAL | 6 refills | Status: DC

## 2024-02-29 NOTE — Patient Instructions (Addendum)
 Medication Instructions:   Begin Amlodipine 5mg  daily  Continue all other medications.     Labwork:  none  Testing/Procedures:  none  Follow-Up:  6 months   Any Other Special Instructions Will Be Listed Below (If Applicable).  Nurse visit in 2 weeks for BP check   If you need a refill on your cardiac medications before your next appointment, please call your pharmacy.

## 2024-02-29 NOTE — Progress Notes (Signed)
 Clinical Summary Traci Koch is a 83 y.o.female seen today for follow up of the following medical problems.   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 Was to stop eliquis - denies any palpitatons - EKG today shows NSR   2.HTN - home 150s/50s - with resolution of afib she is now off diltiazem, not currently on any bp agents.     Past Medical History:  Diagnosis Date   Diabetes mellitus without complication (HCC)    Hypertension    Thyroid disease      Allergies  Allergen Reactions   Codeine Other (See Comments)    Hallucinations    Penicillins     Swelling    Tetanus-Diphth-Acell Pertussis Swelling     Current Outpatient Medications  Medication Sig Dispense Refill   amLODipine (NORVASC) 5 MG tablet Take 1 tablet (5 mg total) by mouth daily. 30 tablet 6   empagliflozin (JARDIANCE) 25 MG TABS tablet Take 1 tablet (25 mg total) by mouth daily. 30 tablet 0   metFORMIN (GLUCOPHAGE-XR) 500 MG 24 hr tablet Take 1 tablet (500 mg total) by mouth daily with breakfast. 30 tablet 0   No current facility-administered medications for this visit.     Past Surgical History:  Procedure Laterality Date   HIP ARTHROPLASTY Right 05/04/2023   Procedure: ARTHROPLASTY BIPOLAR HIP (HEMIARTHROPLASTY);  Surgeon: Oliver Barre, MD;  Location: [facility];  Service: Orthopedics;  Laterality: Right;     Allergies  Allergen Reactions   Codeine Other (See Comments)    Hallucinations    Penicillins     Swelling    Tetanus-Diphth-Acell Pertussis Swelling      Family History  Problem Relation Age of Onset   Breast cancer Sister      Social History Traci Koch reports that she has never smoked. She has never used smokeless tobacco. Traci Koch reports no history of alcohol  use.   Physical Examination Today's Vitals   02/29/24 1255 02/29/24 1303  BP: (!) 182/80 (!) 180/85  Pulse: 72   SpO2: 97%   Weight: 176 lb 9.6 oz (80.1 kg)   Height: 5\' 2"  (1.575 m)    Body mass index is 32.3 kg/m.   Gen: resting comfortably, no acute distress HEENT: no scleral icterus, pupils equal round and reactive, no palptable cervical adenopathy,  CV: RRR, no mrg, no jvd Resp: Clear to auscultation bilaterally GI: abdomen is soft, non-tender, non-distended, normal bowel sounds, no hepatosplenomegaly MSK: extremities are warm, no edema.  Skin: warm, no rash Neuro:  no focal deficits Psych: appropriate affect   Diagnostic Studies  08/2023 monitor 30 day monitor   Min HR 50, Max HR 112, Avg HR 64   Rare supraventricular ectopy   Rare ventricular ectopy   No symptoms reported   Available telemetry tracings show normsl sinus rhythm  04/2023 echo 1. Left ventricular ejection fraction, by estimation, is 65 to 70%. The  left ventricle has normal function. The left ventricle has no regional  wall motion abnormalities. There is mild left ventricular hypertrophy.  Left ventricular diastolic parameters  are indeterminate.   2. Right ventricular systolic function is normal. The right ventricular  size is normal. There is mildly elevated pulmonary artery systolic  pressure.   3. The mitral valve is normal in structure. No evidence  of mitral valve  regurgitation. No evidence of mitral stenosis.   4. The aortic valve is tricuspid. Aortic valve regurgitation is not  visualized. No aortic stenosis is present.   5. The inferior vena cava is normal in size with <50% respiratory  variability, suggesting right atrial pressure of 8 mmHg.    Assessment and Plan  1.Afib - isolated in postoperative setting, no evidence of recurrence. She is now off all meds including anticoagulation -EKG today shows NSR - continue to monitor  2. HTN - elevated bp, start norvasc 5mg  daily.  Nursing visit in 2 weeks for bp check    F/u 6 months     Antoine Poche, M.D..

## 2024-03-03 ENCOUNTER — Encounter: Payer: Self-pay | Admitting: *Deleted

## 2024-03-07 ENCOUNTER — Encounter: Payer: Self-pay | Admitting: Cardiology

## 2024-03-14 ENCOUNTER — Ambulatory Visit: Attending: Internal Medicine

## 2024-03-14 ENCOUNTER — Telehealth: Payer: Self-pay

## 2024-03-14 VITALS — BP 166/88 | HR 77 | Ht 62.0 in

## 2024-03-14 DIAGNOSIS — I10 Essential (primary) hypertension: Secondary | ICD-10-CM | POA: Insufficient documentation

## 2024-03-14 MED ORDER — AMLODIPINE BESYLATE 10 MG PO TABS: 10.0000 mg | ORAL_TABLET | Freq: Every day | ORAL | 3 refills | Status: DC

## 2024-03-14 NOTE — Telephone Encounter (Signed)
 BP still too high, please increase norvasc to 10mg  daily, nursing visit 1 week for repeat bp   Dominga Ferry MD  Patient informed and verbalized understanding

## 2024-03-14 NOTE — Telephone Encounter (Signed)
-----   Message from Dina Rich sent at 03/14/2024  3:15 PM EDT -----    ----- Message ----- From: Carmelina Paddock, CMA Sent: 03/14/2024   9:26 AM EDT To: Antoine Poche, MD

## 2024-03-14 NOTE — Progress Notes (Signed)
 BP still too high, please increase norvasc to 10mg  daily, nursing visit 1 week for repeat bp  Dominga Ferry MD

## 2024-03-14 NOTE — Progress Notes (Signed)
 Patient in for BP check. Had patient to wait for about 10 minutes before checking. Reported she takes her Amlodipine at night due to it states she may have some dizziness according to side effects. Reports no SOB, dizziness, chest pains or headaches. She brought in her machine as well to compare with our reading. BP on Left-168/80 with her machine 169/88. Took on right arm 166/88. Since she wasn't have any symptoms patient left advised her will send to provider.

## 2024-03-16 LAB — LAB REPORT - SCANNED: EGFR: 82

## 2024-03-22 ENCOUNTER — Ambulatory Visit: Attending: Cardiology | Admitting: *Deleted

## 2024-03-22 ENCOUNTER — Encounter: Payer: Self-pay | Admitting: *Deleted

## 2024-03-22 VITALS — BP 148/72 | HR 77

## 2024-03-22 DIAGNOSIS — I10 Essential (primary) hypertension: Secondary | ICD-10-CM

## 2024-03-22 DIAGNOSIS — Z79899 Other long term (current) drug therapy: Secondary | ICD-10-CM

## 2024-03-22 NOTE — Progress Notes (Unsigned)
 Patient in office for BP check - states that she takes her medications around 6-9 pm.  Wanted to let us know that she recently started having some issues with both wrists - knot came up on left one.  [facility] to [facility] - they give her some wrist braces, did labs, & give her rx for Meloxicam 15mg  daily x 10 days.  States they called her back & told her that her labs showed she had Lupus.  They have referred her to [facility] & an Endocrinologist.    Wanted to confirm with you if okay to take this medication first.    Will request notes & labs from urgent care.

## 2024-03-22 NOTE — Progress Notes (Signed)
 BP still too high, continue norvasc 10mg  and add losartan 25mg  daily. Needs bmet 2 weeks, repeat nursing visit 2 weeks for bp check. Meloxicam ok for short duration, would not stay on long term, 10 days is fine   Dominga Ferry MD

## 2024-03-25 MED ORDER — LOSARTAN POTASSIUM 25 MG PO TABS: 25.0000 mg | ORAL_TABLET | Freq: Every day | ORAL | 6 refills | Status: DC

## 2024-03-25 NOTE — Progress Notes (Signed)
 Patient notified and verbalized understanding.  New prescription sent to [facility].  Lab order mailed to home & nurse visit scheduled as well.

## 2024-04-11 ENCOUNTER — Ambulatory Visit: Attending: Cardiology | Admitting: *Deleted

## 2024-04-11 MED ORDER — LOSARTAN POTASSIUM 25 MG PO TABS: 50.0000 mg | ORAL_TABLET | Freq: Every day | ORAL | Status: DC

## 2024-04-11 NOTE — Progress Notes (Signed)
 BP still a little elevated, please increase losartan to 50mg  daily  Letta Raw MD

## 2024-04-11 NOTE — Progress Notes (Signed)
 Patient in office for nurse bp check -   138/72  62    Please review labs done 03/16/2024 - done [facility]

## 2024-04-11 NOTE — Progress Notes (Signed)
 Patient notified and verbalized understanding.  She will continue to keep log & drop off for provider review in about 14 days.    States her BP readings are always lower at home than in the office but she does take her medications at night because she had concerns that the medication may make her dizzy.  Suggested that she try changing to the day time & see how readings are after.

## 2024-05-31 ENCOUNTER — Telehealth: Payer: Self-pay | Admitting: Cardiology

## 2024-05-31 MED ORDER — LOSARTAN POTASSIUM 25 MG PO TABS
50.0000 mg | ORAL_TABLET | Freq: Every day | ORAL | 3 refills | Status: DC
Start: 2024-05-31 — End: 2024-09-05

## 2024-05-31 NOTE — Telephone Encounter (Signed)
 Pt's medication was sent to pt's pharmacy as requested. Confirmation received.

## 2024-05-31 NOTE — Telephone Encounter (Signed)
*  STAT* If patient is at the pharmacy, call can be transferred to refill team.   1. Which medications need to be refilled? (please list name of each medication and dose if known)   losartan  (COZAAR ) 25 MG tablet    2. Which pharmacy/location (including street and city if local pharmacy) is medication to be sent to? [id number] - [place], [place] - [address]    3. Do they need a 30 day or 90 day supply? 90

## 2024-08-08 ENCOUNTER — Other Ambulatory Visit (HOSPITAL_COMMUNITY): Payer: Self-pay | Admitting: Internal Medicine

## 2024-08-08 DIAGNOSIS — Z1231 Encounter for screening mammogram for malignant neoplasm of breast: Secondary | ICD-10-CM

## 2024-09-05 ENCOUNTER — Encounter: Payer: Self-pay | Admitting: *Deleted

## 2024-09-05 ENCOUNTER — Ambulatory Visit: Attending: Cardiology | Admitting: Cardiology

## 2024-09-05 ENCOUNTER — Encounter: Payer: Self-pay | Admitting: Cardiology

## 2024-09-05 VITALS — BP 158/82 | HR 76 | Ht 62.0 in | Wt 186.4 lb

## 2024-09-05 DIAGNOSIS — I10 Essential (primary) hypertension: Secondary | ICD-10-CM | POA: Diagnosis present

## 2024-09-05 DIAGNOSIS — I4891 Unspecified atrial fibrillation: Secondary | ICD-10-CM | POA: Insufficient documentation

## 2024-09-05 MED ORDER — AMLODIPINE BESYLATE 5 MG PO TABS: 5.0000 mg | ORAL_TABLET | Freq: Every day | ORAL | 1 refills | Status: AC

## 2024-09-05 NOTE — Patient Instructions (Addendum)
 Medication Instructions:   Stop Losartan   Continue the Amlodipine  at 5mg  daily - new prescription sent to pharmacy today Continue all other medications.     Labwork:  none  Testing/Procedures:  none  Follow-Up:  6 months   Any Other Special Instructions Will Be Listed Below (If Applicable).   If you need a refill on your cardiac medications before your next appointment, please call your pharmacy.

## 2024-09-05 NOTE — Progress Notes (Signed)
 Clinical Summary Traci Koch is a 83 y.o.female seen today for follow up of the following medical problems.    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 Was to stop eliquis  - subsequent clinic EKGs have all show NSR -rare palpitations       2.HTN - high bp last visit, started norvasc  5mg  daily and later titrated to 10mg  daily. Losartan  25mg  daily was then added and increased to 50mg  daily.   - homes bp's 110s-120s/50s-60s. She previously confirmed her home bp cuff is accurate at a prior visit - taking norvasc  5mg  daily.  - reported leg swelling, she lowered norvasc  back to 5mg  daily and stopped her losartan . Swelling improved    Past Medical History:  Diagnosis Date   Diabetes mellitus without complication (HCC)    Hypertension    Thyroid  disease      Allergies  Allergen Reactions   Codeine Other (See Comments)    Hallucinations    Penicillins     Swelling    Tetanus-Diphth-Acell Pertussis Swelling     Current Outpatient Medications  Medication Sig Dispense Refill   amLODipine  (NORVASC ) 10 MG tablet Take 1 tablet (10 mg total) by mouth daily. 90 tablet 3   empagliflozin  (JARDIANCE ) 25 MG TABS tablet Take 1 tablet (25 mg total) by mouth daily. 30 tablet 0   losartan  (COZAAR ) 25 MG tablet Take 2 tablets (50 mg total) by mouth daily. 180 tablet 3   metFORMIN  (GLUCOPHAGE -XR) 500 MG 24 hr tablet Take 1 tablet (500 mg total) by mouth daily with breakfast. 30 tablet 0   No current facility-administered medications for this visit.     Past Surgical History:  Procedure Laterality Date   HIP ARTHROPLASTY Right 05/04/2023   Procedure: ARTHROPLASTY BIPOLAR HIP (HEMIARTHROPLASTY);  Surgeon: Onesimo Oneil LABOR, MD;  Location: [facility];  Service: Orthopedics;  Laterality: Right;      Allergies  Allergen Reactions   Codeine Other (See Comments)    Hallucinations    Penicillins     Swelling    Tetanus-Diphth-Acell Pertussis Swelling      Family History  Problem Relation Age of Onset   Breast cancer Sister      Social History Ms. Inch reports that she has never smoked. She has never used smokeless tobacco. Ms. Codispoti reports no history of alcohol use.    Physical Examination Today's Vitals   09/05/24 0959 09/05/24 1005  BP: (!) 156/80 (!) 158/82  Pulse: 76   SpO2: 96%   Weight: 186 lb 6.4 oz (84.6 kg)   Height: 5' 2 (1.575 m)    Body mass index is 34.09 kg/m.  Gen: resting comfortably, no acute distress HEENT: no scleral icterus, pupils equal round and reactive, no palptable cervical adenopathy,  CV: RRR, no m/rg, no jvd Resp: Clear to auscultation bilaterally GI: abdomen is soft, non-tender, non-distended, normal bowel sounds, no hepatosplenomegaly MSK: extremities are warm, no edema.  Skin: warm, no rash Neuro:  no focal deficits Psych: appropriate affect   Diagnostic Studies 08/2023 monitor 30 day monitor   Min HR 50, Max HR 112, Avg HR 64   Rare supraventricular ectopy   Rare ventricular ectopy   No symptoms reported   Available telemetry tracings show normsl sinus rhythm   04/2023 echo 1. Left ventricular ejection fraction, by  estimation, is 65 to 70%. The  left ventricle has normal function. The left ventricle has no regional  wall motion abnormalities. There is mild left ventricular hypertrophy.  Left ventricular diastolic parameters  are indeterminate.   2. Right ventricular systolic function is normal. The right ventricular  size is normal. There is mildly elevated pulmonary artery systolic  pressure.   3. The mitral valve is normal in structure. No evidence of mitral valve  regurgitation. No evidence of mitral stenosis.   4. The aortic valve is tricuspid. Aortic valve regurgitation is not  visualized. No aortic  stenosis is present.   5. The inferior vena cava is normal in size with <50% respiratory  variability, suggesting right atrial pressure of 8 mmHg.     Assessment and Plan   1.Afib, postoperative - isolated in postoperative setting, no evidence of recurrence. She is now off all meds including anticoagulation -her EKG today shows NSR - continue to monitor   2. HTN - elevated in clinic but home bp's well controlled. 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    F/u 6 months  Dorn PHEBE Ross, M.D.

## 2024-11-02 ENCOUNTER — Ambulatory Visit (HOSPITAL_COMMUNITY)

## 2024-11-07 ENCOUNTER — Encounter (HOSPITAL_COMMUNITY): Payer: Self-pay

## 2024-11-07 ENCOUNTER — Ambulatory Visit (HOSPITAL_COMMUNITY)
Admission: RE | Admit: 2024-11-07 | Discharge: 2024-11-07 | Disposition: A | Discharge status: Home / Self Care | Source: Ambulatory Visit | Attending: Internal Medicine | Admitting: Internal Medicine

## 2024-11-07 DIAGNOSIS — Z1231 Encounter for screening mammogram for malignant neoplasm of breast: Secondary | ICD-10-CM | POA: Insufficient documentation

## 2025-03-06 ENCOUNTER — Ambulatory Visit: Admitting: Cardiology
